# Patient Record
Sex: Male | Born: 1975
Health system: Southern US, Community
[De-identification: ages and names within clinical notes are randomized; demographics above are authoritative.]

## PROBLEM LIST (undated history)

## (undated) ENCOUNTER — Emergency Department (HOSPITAL_COMMUNITY): Payer: BC Managed Care – PPO

## (undated) DIAGNOSIS — K045 Chronic apical periodontitis: Secondary | ICD-10-CM

## (undated) DIAGNOSIS — F32A Depression, unspecified: Secondary | ICD-10-CM

## (undated) DIAGNOSIS — T7840XA Allergy, unspecified, initial encounter: Secondary | ICD-10-CM

## (undated) DIAGNOSIS — T4145XA Adverse effect of unspecified anesthetic, initial encounter: Secondary | ICD-10-CM

## (undated) DIAGNOSIS — T8859XA Other complications of anesthesia, initial encounter: Secondary | ICD-10-CM

## (undated) DIAGNOSIS — I10 Essential (primary) hypertension: Secondary | ICD-10-CM

## (undated) DIAGNOSIS — G4733 Obstructive sleep apnea (adult) (pediatric): Secondary | ICD-10-CM

## (undated) DIAGNOSIS — F329 Major depressive disorder, single episode, unspecified: Secondary | ICD-10-CM

## (undated) HISTORY — DX: Essential (primary) hypertension: I10

## (undated) HISTORY — PX: FINGER SURGERY: SHX640

## (undated) HISTORY — DX: Allergy, unspecified, initial encounter: T78.40XA

## (undated) HISTORY — DX: Obstructive sleep apnea (adult) (pediatric): G47.33

## (undated) HISTORY — PX: NASAL SINUS SURGERY: SHX719

---
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
## 1998-09-05 ENCOUNTER — Emergency Department (HOSPITAL_COMMUNITY): Admission: EM | Admit: 1998-09-05 | Discharge: 1998-09-05 | Payer: Self-pay | Admitting: Family Medicine

## 1999-01-26 ENCOUNTER — Emergency Department (HOSPITAL_COMMUNITY): Admission: EM | Admit: 1999-01-26 | Discharge: 1999-01-26 | Payer: Self-pay | Admitting: *Deleted

## 1999-03-19 ENCOUNTER — Inpatient Hospital Stay (HOSPITAL_COMMUNITY): Admission: EM | Admit: 1999-03-19 | Discharge: 1999-03-29 | Payer: Self-pay | Admitting: Emergency Medicine

## 1999-03-19 ENCOUNTER — Encounter: Payer: Self-pay | Admitting: Emergency Medicine

## 1999-03-21 ENCOUNTER — Encounter: Payer: Self-pay | Admitting: Surgery

## 1999-03-26 ENCOUNTER — Encounter: Payer: Self-pay | Admitting: Surgery

## 1999-03-29 ENCOUNTER — Encounter: Payer: Self-pay | Admitting: Surgery

## 1999-04-06 ENCOUNTER — Encounter: Payer: Self-pay | Admitting: Emergency Medicine

## 1999-04-06 ENCOUNTER — Inpatient Hospital Stay (HOSPITAL_COMMUNITY): Admission: EM | Admit: 1999-04-06 | Discharge: 1999-04-10 | Payer: Self-pay | Admitting: Emergency Medicine

## 1999-04-07 ENCOUNTER — Encounter: Payer: Self-pay | Admitting: Surgery

## 1999-04-08 ENCOUNTER — Encounter: Payer: Self-pay | Admitting: General Surgery

## 1999-04-09 ENCOUNTER — Encounter: Payer: Self-pay | Admitting: Surgery

## 1999-04-10 ENCOUNTER — Encounter: Payer: Self-pay | Admitting: Surgery

## 1999-04-11 ENCOUNTER — Encounter: Payer: Self-pay | Admitting: Surgery

## 1999-04-11 ENCOUNTER — Ambulatory Visit (HOSPITAL_COMMUNITY): Admission: RE | Admit: 1999-04-11 | Discharge: 1999-04-11 | Payer: Self-pay | Admitting: Surgery

## 1999-04-12 ENCOUNTER — Ambulatory Visit (HOSPITAL_COMMUNITY): Admission: RE | Admit: 1999-04-12 | Discharge: 1999-04-12 | Payer: Self-pay | Admitting: Surgery

## 1999-04-12 ENCOUNTER — Encounter: Payer: Self-pay | Admitting: Surgery

## 1999-04-16 ENCOUNTER — Ambulatory Visit (HOSPITAL_COMMUNITY): Admission: RE | Admit: 1999-04-16 | Discharge: 1999-04-16 | Payer: Self-pay | Admitting: Surgery

## 1999-04-16 ENCOUNTER — Encounter: Payer: Self-pay | Admitting: Surgery

## 1999-04-25 ENCOUNTER — Encounter: Payer: Self-pay | Admitting: Surgery

## 1999-04-25 ENCOUNTER — Ambulatory Visit (HOSPITAL_COMMUNITY): Admission: RE | Admit: 1999-04-25 | Discharge: 1999-04-25 | Payer: Self-pay | Admitting: Surgery

## 1999-06-11 ENCOUNTER — Ambulatory Visit (HOSPITAL_COMMUNITY): Admission: RE | Admit: 1999-06-11 | Discharge: 1999-06-11 | Payer: Self-pay | Admitting: Surgery

## 1999-06-11 ENCOUNTER — Encounter: Payer: Self-pay | Admitting: Surgery

## 2001-01-04 ENCOUNTER — Emergency Department (HOSPITAL_COMMUNITY): Admission: EM | Admit: 2001-01-04 | Discharge: 2001-01-05 | Payer: Self-pay | Admitting: *Deleted

## 2001-01-06 ENCOUNTER — Emergency Department (HOSPITAL_COMMUNITY): Admission: EM | Admit: 2001-01-06 | Discharge: 2001-01-06 | Payer: Self-pay | Admitting: Emergency Medicine

## 2001-10-22 ENCOUNTER — Emergency Department (HOSPITAL_COMMUNITY): Admission: EM | Admit: 2001-10-22 | Discharge: 2001-10-22 | Payer: Self-pay | Admitting: Internal Medicine

## 2003-12-13 ENCOUNTER — Emergency Department (HOSPITAL_COMMUNITY): Admission: EM | Admit: 2003-12-13 | Discharge: 2003-12-13 | Payer: Self-pay | Admitting: Emergency Medicine

## 2003-12-15 ENCOUNTER — Emergency Department (HOSPITAL_COMMUNITY): Admission: EM | Admit: 2003-12-15 | Discharge: 2003-12-15 | Payer: Self-pay | Admitting: Emergency Medicine

## 2004-05-25 ENCOUNTER — Emergency Department (HOSPITAL_COMMUNITY): Admission: EM | Admit: 2004-05-25 | Discharge: 2004-05-25 | Payer: Self-pay | Admitting: Family Medicine

## 2004-09-06 ENCOUNTER — Ambulatory Visit: Payer: Self-pay | Admitting: Internal Medicine

## 2004-09-09 ENCOUNTER — Ambulatory Visit: Payer: Self-pay | Admitting: Internal Medicine

## 2004-11-07 ENCOUNTER — Emergency Department (HOSPITAL_COMMUNITY): Admission: EM | Admit: 2004-11-07 | Discharge: 2004-11-07 | Payer: Self-pay | Admitting: Family Medicine

## 2004-11-10 ENCOUNTER — Emergency Department (HOSPITAL_COMMUNITY): Admission: EM | Admit: 2004-11-10 | Discharge: 2004-11-10 | Payer: Self-pay | Admitting: Family Medicine

## 2004-11-26 ENCOUNTER — Ambulatory Visit (HOSPITAL_COMMUNITY): Admission: RE | Admit: 2004-11-26 | Discharge: 2004-11-26 | Payer: Self-pay | Admitting: Orthopedic Surgery

## 2005-05-21 ENCOUNTER — Ambulatory Visit: Payer: Self-pay | Admitting: Internal Medicine

## 2005-06-17 ENCOUNTER — Ambulatory Visit (HOSPITAL_BASED_OUTPATIENT_CLINIC_OR_DEPARTMENT_OTHER): Admission: RE | Admit: 2005-06-17 | Discharge: 2005-06-17 | Payer: Self-pay | Admitting: Internal Medicine

## 2005-06-18 ENCOUNTER — Ambulatory Visit (HOSPITAL_COMMUNITY): Admission: RE | Admit: 2005-06-18 | Discharge: 2005-06-18 | Payer: Self-pay | Admitting: Internal Medicine

## 2005-06-22 ENCOUNTER — Ambulatory Visit: Payer: Self-pay | Admitting: Internal Medicine

## 2005-06-23 ENCOUNTER — Ambulatory Visit: Payer: Self-pay | Admitting: Internal Medicine

## 2005-07-08 ENCOUNTER — Ambulatory Visit: Payer: Self-pay | Admitting: Hospitalist

## 2005-07-23 ENCOUNTER — Ambulatory Visit: Payer: Self-pay | Admitting: Internal Medicine

## 2005-08-13 ENCOUNTER — Ambulatory Visit: Payer: Self-pay | Admitting: Internal Medicine

## 2005-12-31 ENCOUNTER — Ambulatory Visit: Payer: Self-pay | Admitting: Internal Medicine

## 2006-03-11 DIAGNOSIS — J019 Acute sinusitis, unspecified: Secondary | ICD-10-CM | POA: Insufficient documentation

## 2006-03-11 DIAGNOSIS — G4730 Sleep apnea, unspecified: Secondary | ICD-10-CM | POA: Insufficient documentation

## 2006-03-11 DIAGNOSIS — E669 Obesity, unspecified: Secondary | ICD-10-CM

## 2006-03-11 DIAGNOSIS — I10 Essential (primary) hypertension: Secondary | ICD-10-CM | POA: Insufficient documentation

## 2006-03-11 DIAGNOSIS — J309 Allergic rhinitis, unspecified: Secondary | ICD-10-CM | POA: Insufficient documentation

## 2006-05-05 DIAGNOSIS — G4733 Obstructive sleep apnea (adult) (pediatric): Secondary | ICD-10-CM

## 2006-05-05 HISTORY — DX: Obstructive sleep apnea (adult) (pediatric): G47.33

## 2006-06-01 DIAGNOSIS — J330 Polyp of nasal cavity: Secondary | ICD-10-CM | POA: Insufficient documentation

## 2006-08-20 ENCOUNTER — Telehealth: Payer: Self-pay | Admitting: *Deleted

## 2006-09-24 ENCOUNTER — Telehealth (INDEPENDENT_AMBULATORY_CARE_PROVIDER_SITE_OTHER): Payer: Self-pay | Admitting: *Deleted

## 2006-12-30 ENCOUNTER — Ambulatory Visit (HOSPITAL_COMMUNITY): Admission: RE | Admit: 2006-12-30 | Discharge: 2006-12-30 | Payer: Self-pay | Admitting: Internal Medicine

## 2007-01-10 ENCOUNTER — Emergency Department (HOSPITAL_COMMUNITY): Admission: EM | Admit: 2007-01-10 | Discharge: 2007-01-10 | Payer: Self-pay | Admitting: Emergency Medicine

## 2008-10-30 ENCOUNTER — Emergency Department (HOSPITAL_COMMUNITY): Admission: EM | Admit: 2008-10-30 | Discharge: 2008-10-30 | Payer: Self-pay | Admitting: Emergency Medicine

## 2009-05-01 ENCOUNTER — Emergency Department (HOSPITAL_COMMUNITY): Admission: EM | Admit: 2009-05-01 | Discharge: 2009-05-01 | Payer: Self-pay | Admitting: Family Medicine

## 2009-10-13 ENCOUNTER — Emergency Department (HOSPITAL_COMMUNITY): Admission: EM | Admit: 2009-10-13 | Discharge: 2009-10-13 | Payer: Self-pay | Admitting: Emergency Medicine

## 2010-07-16 ENCOUNTER — Inpatient Hospital Stay (INDEPENDENT_AMBULATORY_CARE_PROVIDER_SITE_OTHER)
Admission: RE | Admit: 2010-07-16 | Discharge: 2010-07-16 | Disposition: A | Discharge status: Home / Self Care | Payer: BLUE CROSS/BLUE SHIELD | Source: Ambulatory Visit | Attending: Emergency Medicine | Admitting: Emergency Medicine

## 2010-07-16 DIAGNOSIS — M79609 Pain in unspecified limb: Secondary | ICD-10-CM

## 2010-07-16 LAB — POCT I-STAT, CHEM 8
Calcium, Ion: 1.17 mmol/L (ref 1.12–1.32)
Chloride: 105 mEq/L (ref 96–112)
Creatinine, Ser: 1 mg/dL (ref 0.4–1.5)
Glucose, Bld: 100 mg/dL — ABNORMAL HIGH (ref 70–99)
Potassium: 3.6 mEq/L (ref 3.5–5.1)
Sodium: 140 mEq/L (ref 135–145)

## 2010-07-16 LAB — CBC
HCT: 42.4 % (ref 39.0–52.0)
MCH: 29.6 pg (ref 26.0–34.0)
MCV: 86 fL (ref 78.0–100.0)
RBC: 4.93 MIL/uL (ref 4.22–5.81)
RDW: 12.5 % (ref 11.5–15.5)
WBC: 7.6 10*3/uL (ref 4.0–10.5)

## 2010-09-20 NOTE — Procedures (Signed)
NAME:  Marc Johnston, Marc Johnston                ACCOUNT NO.:  [id number]   MEDICAL RECORD NO.:  [id number]          PATIENT TYPE:  OUT   LOCATION:  SLEEP CENTER                 FACILITY:  [facility]   PHYSICIAN:  Clinton D. Maple Hudson, M.D. DATE OF BIRTH:  [birth year]   DATE OF STUDY:  06/18/2005                              NOCTURNAL POLYSOMNOGRAM   REFERRING PHYSICIAN:  Dr. Mont Dutton.   DATE OF STUDY:  June 17, 2005.   INDICATION FOR STUDY:  Hypersomnia with sleep apnea.   EPWORTH SLEEPINESS SCORE:  6/24.   BMI:  28.   WEIGHT:  210 pounds.   No home medications listed.   SLEEP ARCHITECTURE:  Total sleep time 299 minutes with sleep efficiency 82%.  Stage I was 4%, stage II 53%, stages III and IV 28%, REM 15% of total sleep  time. Sleep latency 12 minutes, REM latency 77 minutes, awake after sleep  onset 13 minutes, arousal index 12.6. No bedtime medication taken.   RESPIRATORY DATA:  Apnea/hypopnea index (AHI, RDI) 5.4 obstructive events  per hour indicating very minimal obstructive sleep apnea/hypopnea syndrome  (normal ranges 2/5 episodes per hour). This included 1 central apnea, 4  obstructive apneas and 22 hypopneas. Events were positional, mostly reported  while supine. REM AHI 16 per hour.   OXYGEN DATA:  Moderate snoring, occasionally loud with oxygen desaturation  to a nadir of 85%. Mean oxygen saturation on room air was 93% through the  study.   CARDIAC DATA:  Sinus rhythm with inverted T-wave on the reporting lead.   MOVEMENT/PARASOMNIA:  The patient insisted on going at 4 a.m. to smoke in  his car. Occasional leg jerk with little effect on sleep.   IMPRESSION/RECOMMENDATIONS:  1.  Very minimal obstructive sleep apnea/hypopnea syndrome, AHI 5.4 per hour      (normal range 2/5 per hour) with positional events, moderate to loud      snoring and oxygen desaturation to a nadir of 85%.  2.  The study ended at 4 a.m. when the patient insisted on going out to      smoke in his  car.  3.  Suggest checking 12-lead EKG for T-wave configuration if clinically      appropriate.  4.  Specific therapy for sleep disordered breathing is probably not      indicated. Weight loss and sleep off flat of back would be recommended      but primary emphasis should be on smoking cessation and good sleep      hygiene.      Clinton D. Maple Hudson, M.D.  Diplomate, Biomedical engineer of Sleep Medicine  Electronically Signed     CDY/MEDQ  D:  06/22/2005 10:35:00  T:  06/22/2005 22:00:46  Job:  [id number]

## 2010-10-03 ENCOUNTER — Emergency Department (HOSPITAL_COMMUNITY): Payer: BLUE CROSS/BLUE SHIELD

## 2010-10-03 ENCOUNTER — Emergency Department (HOSPITAL_COMMUNITY)
Admission: EM | Admit: 2010-10-03 | Discharge: 2010-10-03 | Disposition: A | Discharge status: Home / Self Care | Payer: BLUE CROSS/BLUE SHIELD | Attending: Emergency Medicine | Admitting: Emergency Medicine

## 2010-10-03 DIAGNOSIS — R059 Cough, unspecified: Secondary | ICD-10-CM | POA: Insufficient documentation

## 2010-10-03 DIAGNOSIS — I10 Essential (primary) hypertension: Secondary | ICD-10-CM | POA: Insufficient documentation

## 2010-10-03 DIAGNOSIS — R05 Cough: Secondary | ICD-10-CM | POA: Insufficient documentation

## 2010-10-03 DIAGNOSIS — G8929 Other chronic pain: Secondary | ICD-10-CM | POA: Insufficient documentation

## 2010-10-03 DIAGNOSIS — R062 Wheezing: Secondary | ICD-10-CM | POA: Insufficient documentation

## 2010-12-04 ENCOUNTER — Other Ambulatory Visit (HOSPITAL_COMMUNITY): Payer: Self-pay | Admitting: Internal Medicine

## 2010-12-04 DIAGNOSIS — IMO0002 Reserved for concepts with insufficient information to code with codable children: Secondary | ICD-10-CM

## 2010-12-04 DIAGNOSIS — M25569 Pain in unspecified knee: Secondary | ICD-10-CM

## 2010-12-04 DIAGNOSIS — M7520 Bicipital tendinitis, unspecified shoulder: Secondary | ICD-10-CM

## 2010-12-06 ENCOUNTER — Ambulatory Visit (HOSPITAL_COMMUNITY)
Admission: RE | Admit: 2010-12-06 | Discharge: 2010-12-06 | Disposition: A | Discharge status: Home / Self Care | Payer: BC Managed Care – PPO | Source: Ambulatory Visit | Attending: Internal Medicine | Admitting: Internal Medicine

## 2010-12-06 DIAGNOSIS — M25569 Pain in unspecified knee: Secondary | ICD-10-CM

## 2010-12-06 DIAGNOSIS — S46909A Unspecified injury of unspecified muscle, fascia and tendon at shoulder and upper arm level, unspecified arm, initial encounter: Secondary | ICD-10-CM | POA: Insufficient documentation

## 2010-12-06 DIAGNOSIS — M545 Low back pain, unspecified: Secondary | ICD-10-CM | POA: Insufficient documentation

## 2010-12-06 DIAGNOSIS — S4980XA Other specified injuries of shoulder and upper arm, unspecified arm, initial encounter: Secondary | ICD-10-CM | POA: Insufficient documentation

## 2010-12-06 DIAGNOSIS — IMO0002 Reserved for concepts with insufficient information to code with codable children: Secondary | ICD-10-CM

## 2010-12-06 DIAGNOSIS — M7520 Bicipital tendinitis, unspecified shoulder: Secondary | ICD-10-CM

## 2010-12-06 DIAGNOSIS — M5126 Other intervertebral disc displacement, lumbar region: Secondary | ICD-10-CM | POA: Insufficient documentation

## 2010-12-06 DIAGNOSIS — M25519 Pain in unspecified shoulder: Secondary | ICD-10-CM | POA: Insufficient documentation

## 2010-12-06 DIAGNOSIS — X500XXA Overexertion from strenuous movement or load, initial encounter: Secondary | ICD-10-CM | POA: Insufficient documentation

## 2010-12-06 DIAGNOSIS — R937 Abnormal findings on diagnostic imaging of other parts of musculoskeletal system: Secondary | ICD-10-CM | POA: Insufficient documentation

## 2010-12-09 ENCOUNTER — Ambulatory Visit (HOSPITAL_COMMUNITY)
Admission: RE | Admit: 2010-12-09 | Discharge: 2010-12-09 | Disposition: A | Discharge status: Home / Self Care | Payer: BC Managed Care – PPO | Source: Ambulatory Visit | Attending: Internal Medicine | Admitting: Internal Medicine

## 2010-12-09 DIAGNOSIS — M25529 Pain in unspecified elbow: Secondary | ICD-10-CM | POA: Insufficient documentation

## 2010-12-09 DIAGNOSIS — IMO0002 Reserved for concepts with insufficient information to code with codable children: Secondary | ICD-10-CM

## 2010-12-09 DIAGNOSIS — M25569 Pain in unspecified knee: Secondary | ICD-10-CM

## 2010-12-09 DIAGNOSIS — M25429 Effusion, unspecified elbow: Secondary | ICD-10-CM | POA: Insufficient documentation

## 2010-12-09 DIAGNOSIS — M7520 Bicipital tendinitis, unspecified shoulder: Secondary | ICD-10-CM

## 2011-02-14 LAB — COMPREHENSIVE METABOLIC PANEL
Alkaline Phosphatase: 94
BUN: 10
Calcium: 9.7
Chloride: 105
GFR calc Af Amer: >60
Potassium: 3.4 — ABNORMAL LOW

## 2011-02-14 LAB — DIFFERENTIAL
Basophils Absolute: 0.1
Basophils Relative: 1
Eosinophils Absolute: 0.1
Eosinophils Relative: 1
Monocytes Relative: 6
Neutrophils Relative %: 71

## 2011-02-14 LAB — ROCKY MTN SPOTTED FVR AB, IGG-BLOOD: RMSF IgG: <1:64 {titer}

## 2011-02-14 LAB — CBC
MCHC: 35.2
MCV: 88.8
WBC: 11.5 — ABNORMAL HIGH

## 2011-03-19 ENCOUNTER — Ambulatory Visit (HOSPITAL_COMMUNITY)
Admission: RE | Admit: 2011-03-19 | Discharge: 2011-03-19 | Disposition: A | Discharge status: Home / Self Care | Payer: BC Managed Care – PPO | Source: Ambulatory Visit | Attending: Orthopedic Surgery | Admitting: Orthopedic Surgery

## 2011-03-19 DIAGNOSIS — M25519 Pain in unspecified shoulder: Secondary | ICD-10-CM | POA: Insufficient documentation

## 2011-03-19 DIAGNOSIS — IMO0001 Reserved for inherently not codable concepts without codable children: Secondary | ICD-10-CM | POA: Insufficient documentation

## 2011-03-19 DIAGNOSIS — I10 Essential (primary) hypertension: Secondary | ICD-10-CM | POA: Insufficient documentation

## 2011-03-19 DIAGNOSIS — M25619 Stiffness of unspecified shoulder, not elsewhere classified: Secondary | ICD-10-CM | POA: Insufficient documentation

## 2011-03-19 DIAGNOSIS — M6281 Muscle weakness (generalized): Secondary | ICD-10-CM | POA: Insufficient documentation

## 2011-03-19 DIAGNOSIS — IMO0002 Reserved for concepts with insufficient information to code with codable children: Secondary | ICD-10-CM | POA: Insufficient documentation

## 2011-03-19 NOTE — Progress Notes (Signed)
Occupational Therapy Evaluation  Patient Details  Name: Marc Johnston MRN: [id number] Date of Birth: April 21, 1976  Today's Date: 03/19/2011 Time: 0930-1000 Time Calculation (min): 30 min OT Eval [phone] 11' Manual Therapy [phone] 12' HEP [phone] 5' no charge Visit#: 1  of 6   Re-eval: 04/09/11  Assessment Diagnosis: Right Shoulder Strain - Dr. Teressa Senter Next MD Visit: 04/07/11 Prior Therapy: none  Past Medical History: No past medical history on file. Past Surgical History: No past surgical history on file.  Subjective Symptoms/Limitations Symptoms: I want to get my arm back to where I can go to work, play with my kids, and ride my motorcycle. Limitations: [name] was lifting a 90 pound box of meat at work on 11/22/10, and strained his right shoulder.  He consulted with Dr. Teressa Senter and was given a strengthening HEP.  [name] reports that he was not doing the exercises correctly, so he quit doing them.  Dr. Teressa Senter has referred him to OT to strengthen his right shoulder gridle and increase internal rotation in both shoulder.   Pain Assessment Currently in Pain?: Yes Pain Score:   5 Pain Location: Shoulder Pain Orientation: Right Pain Type: Acute pain  Precautions/Restrictions  Precautions Precaution Comments:  (complete all IR and ER in neutral (adducted) position)  Prior Functioning     Assessment RUE Assessment RUE Assessment:  (assessed in seated, IR and ER 0 abduction) RUE AROM (degrees) Right Shoulder Flexion  0-170: 160 Degrees Right Shoulder ABduction 0-140: 147 Degrees Right Shoulder Internal Rotation  0-70: 55 Degrees Right Shoulder External Rotation  0-90: 25 Degrees Right Elbow Flexion/Extension 0-135-150: 140  RUE Strength Right Shoulder Flexion: 5/5 Right Shoulder ABduction: 5/5 Right Shoulder Internal Rotation: 5/5 Right Shoulder External Rotation: 5/5 LUE AROM (degrees) Left Shoulder Internal Rotation  0-70: 75 Degrees Left Shoulder External  Rotation  0-90: 35 Degrees  Exercise/Treatments Supine Protraction: PROM;10 reps Horizontal ABduction: PROM;10 reps External Rotation: PROM;10 reps Internal Rotation: PROM;10 reps Flexion: PROM;10 reps ABduction: PROM;10 reps       Manual Therapy Manual Therapy: Myofascial release Myofascial Release: MFR and manual stretching to decrease pain and fascial restrictions in right shoulder girlde region.  Minimal fascial restrictions noted in his right shoulder region.  [phone]  Occupational Therapy Assessment and Plan OT Assessment and Plan Clinical Impression Statement: A:  Patient presents with increased pain and fascial restrictions and decreased AROM and sustained activity tolerance in his right shoulder, causing decreased I with his B/IADL, work, and leisure activities. Rehab Potential: Good OT Frequency: Min 2X/week OT Duration: Other (comment) (3 weeks) OT Treatment/Interventions: Self-care/ADL training;Therapeutic exercise;Manual therapy;Therapeutic activities;Patient/family education (modalities as needed, HEP:  Shoulder strengthening) OT Plan: P:  Skilled OT intervention to decrease pain and restrictions and increase AROM and sustained activity tolerance in his right shoulder region for return to full I with all daily activities.  Treatment Plan:  MFR to right shoulder as above.  Ther Ex:  strengthening exercises for shoulder seated and prone.  wall wash, overhead lace, x to v, w arms, tband for scapular stability and progress to power tower as able, cybex press and row, UBE.  COMPLETE ALL ER AND IR IN ) ABD.   Goals Short Term Goals Time to Complete Short Term Goals: 3 weeks Short Term Goal 1: Patient will be educated and I with HEP. Short Term Goal 2: Patient will increase right shoulder and left int/ext rotation to WNL AROM for increased ability to play with his kids. Short Term Goal 3: Patient  will increase sustained activity tolerance in his right shoulder from fair + to  good + for increased ability to complete work activities. Short Term Goal 4: Patient will increase right scapular stability from good to normal. Short Term Goal 5: Patient will decrease pain from 5/10 to 2/10 in his right shoulder while picking up his children. Additional Short Term Goals?: Yes Short Term Goal 6: Patient will decrease fascial restrictions in his right shoulder  and left internal/external rotator cuff from min-mod to trace. End of Session Patient Active Problem List  Diagnoses  . OBESITY  . HYPERTENSION  . SINUSITIS, ACUTE  . NASAL POLYP  . ALLERGIC RHINITIS  . SLEEP APNEA  . Sprain and strain of unspecified site of shoulder and upper arm  . Muscle weakness (generalized)  . Pain in joint, shoulder region   End of Session Activity Tolerance: Patient tolerated treatment well General Behavior During Session: [facility] for tasks performed Cognition: [facility] for tasks performed  Time Calculation Start Time: 0930 Stop Time: 1000 Time Calculation (min): 30 min  Shirlean Mylar, OTR/L  03/19/2011, 10:19 AM

## 2011-03-24 ENCOUNTER — Ambulatory Visit (HOSPITAL_COMMUNITY)
Admission: RE | Admit: 2011-03-24 | Discharge: 2011-03-24 | Disposition: A | Discharge status: Home / Self Care | Payer: BC Managed Care – PPO | Source: Ambulatory Visit | Attending: Orthopedic Surgery | Admitting: Orthopedic Surgery

## 2011-03-24 DIAGNOSIS — IMO0002 Reserved for concepts with insufficient information to code with codable children: Secondary | ICD-10-CM

## 2011-03-24 DIAGNOSIS — M25519 Pain in unspecified shoulder: Secondary | ICD-10-CM

## 2011-03-24 DIAGNOSIS — M6281 Muscle weakness (generalized): Secondary | ICD-10-CM

## 2011-03-24 NOTE — Progress Notes (Signed)
Occupational Therapy Treatment  Patient Details  Name: Marc Johnston MRN: [id number] Date of Birth: 26-May-1975  Today's Date: 03/24/2011 Time: 2130-8657 Time Calculation (min): 45 min Manual Therapy [phone] 14' Therapeutic Exercises [phone] 30' Visit#: 2  of 6   Re-eval: 04/09/11    Subjective Symptoms/Limitations Symptoms: S:  Its feeling a little bit better. Pain Assessment Currently in Pain?: Yes Pain Score:   1 Pain Location: Shoulder Pain Orientation: Right Pain Type: Acute pain  O:  Exercise/Treatments Supine Protraction: PROM;10 reps Horizontal ABduction: PROM;10 reps External Rotation: PROM;10 reps Internal Rotation: PROM;10 reps Flexion: PROM;10 reps ABduction: PROM;10 reps Seated Protraction: 10 reps;Strengthening Protraction Weight (lbs): 1 Horizontal ABduction: Strengthening;10 reps Horizontal ABduction Weight (lbs): 1 External Rotation: Strengthening;10 reps External Rotation Weight (lbs): 1 Internal Rotation: Strengthening;10 reps Internal Rotation Weight (lbs): 1 Flexion: Strengthening;10 reps Flexion Weight (lbs): 1 Abduction: Strengthening;10 reps ABduction Weight (lbs): 1 Prone  Retraction: Strengthening;10 reps Retraction Weight (lbs): 1 Flexion: Strengthening;10 reps Flexion Weight (lbs): 1 Extension: Strengthening;10 reps Extension Weight (lbs): 1 Horizontal ABduction 1: Strengthening;10 reps Horizontal ABduction 1 Weight (lbs): 1 Horizontal ABduction 2: Strengthening Horizontal ABduction 2 Weight (lbs): 1 ROM / Strengthening / Isometric Strengthening UBE (Upper Arm Bike): 3' and 3' and 1.5 Cybex Press: 1.5 plate;10 reps Cybex Row: 1.5 plate;10 reps Wall Wash: 3' with 1# Thumb Tacks: 1' "W" Arms: begin next visit X to V Arms: begin next visit Prot/Ret//Elev/Dep: 1'   Manual Therapy Manual Therapy: Myofascial release Myofascial Release: MFR and manual stretching to decrease pain and fascial restrictions in right shoulder girdle  region.  Minimal fascial restrictions noted in his right shoulder region.[phone]  Occupational Therapy Assessment and Plan OT Assessment and Plan Clinical Impression Statement: A:  Less pain this date.  Added strengthening with 1#.   OT Plan: P:  Begin strengthening with 2#, add x to v and w arms.   Goals Short Term Goals Time to Complete Short Term Goals: 3 weeks Short Term Goal 1: Patient will be educated and I with HEP. Short Term Goal 1 Progress: Progressing toward goal Short Term Goal 2: Patient will increase right shoulder and left int/ext rotation to WNL AROM for increased ability to play with his kids. Short Term Goal 2 Progress: Progressing toward goal Short Term Goal 3: Patient will increase sustained activity tolerance in his right shoulder from fair + to good + for increased ability to complete work activities. Short Term Goal 3 Progress: Progressing toward goal Short Term Goal 4: Patient will increase right scapular stability from good to normal. Short Term Goal 4 Progress: Progressing toward goal Short Term Goal 5: Patient will decrease pain from 5/10 to 2/10 in his right shoulder while picking up his children. Short Term Goal 5 Progress: Progressing toward goal Additional Short Term Goals?: Yes Short Term Goal 6: Patient will decrease fascial restrictions in his right shoulder  and left internal/external rotator cuff from min-mod to trace. Short Term Goal 6 Progress: Progressing toward goal End of Session Patient Active Problem List  Diagnoses  . OBESITY  . HYPERTENSION  . SINUSITIS, ACUTE  . NASAL POLYP  . ALLERGIC RHINITIS  . SLEEP APNEA  . Sprain and strain of unspecified site of shoulder and upper arm  . Muscle weakness (generalized)  . Pain in joint, shoulder region   End of Session Activity Tolerance: Patient tolerated treatment well General Behavior During Session: [facility] for tasks performed Cognition: [facility] for tasks performed   Shirlean Mylar,  OTR/L  03/24/2011,  10:15 AM

## 2011-03-26 ENCOUNTER — Ambulatory Visit (HOSPITAL_COMMUNITY)
Admission: RE | Admit: 2011-03-26 | Discharge: 2011-03-26 | Disposition: A | Discharge status: Home / Self Care | Payer: BC Managed Care – PPO | Source: Ambulatory Visit | Attending: Orthopedic Surgery | Admitting: Orthopedic Surgery

## 2011-03-26 DIAGNOSIS — M6281 Muscle weakness (generalized): Secondary | ICD-10-CM

## 2011-03-26 DIAGNOSIS — IMO0002 Reserved for concepts with insufficient information to code with codable children: Secondary | ICD-10-CM

## 2011-03-26 DIAGNOSIS — M25519 Pain in unspecified shoulder: Secondary | ICD-10-CM

## 2011-03-26 NOTE — Progress Notes (Signed)
Occupational Therapy Treatment  Patient Details  Name: COAL NEARHOOD MRN: [id number] Date of Birth: 09-May-1975  Today's Date: 03/26/2011 Time: 4098-1191 Time Calculation (min): 56 min Visit#: 3  of 6   Re-eval: 04/09/11 [facility] [phone] 13' Therapeutic Exercise  [phone] 32'    Subjective Symptoms/Limitations Symptoms: S:  She about killed me the other day.  It was hurting last night. Pain Assessment Currently in Pain?: Yes Pain Score:   3 Pain Location: Shoulder Pain Orientation: Right Pain Type: Acute pain   Exercise/Treatments Supine Protraction: PROM;10 reps Horizontal ABduction: PROM;10 reps External Rotation: PROM;10 reps Internal Rotation: PROM;10 reps Flexion: PROM;10 reps ABduction: PROM;10 reps Seated Elevation:  (add next session with red band) Extension:  (add next session with red tband) Retraction:  (add next session with red tband) Row:  (add next session with red tband) Protraction: Strengthening;12 reps Protraction Weight (lbs): 1 Horizontal ABduction: Strengthening;12 reps Horizontal ABduction Weight (lbs): 1 External Rotation: Strengthening;12 reps External Rotation Weight (lbs): 1 Internal Rotation: Strengthening;12 reps Internal Rotation Weight (lbs): 1 Flexion: Strengthening;12 reps Flexion Weight (lbs): 1 Abduction: Strengthening;12 reps ABduction Weight (lbs): 1 Prone  Retraction: Strengthening;12 reps Retraction Weight (lbs): 1 Flexion: Strengthening;12 reps Flexion Weight (lbs): 1 Extension: Strengthening;12 reps Extension Weight (lbs): 1 Horizontal ABduction 1: Strengthening;12 reps Horizontal ABduction 1 Weight (lbs): 1 Horizontal ABduction 2: Strengthening;12 reps Horizontal ABduction 2 Weight (lbs): 1 ROM / Strengthening / Isometric Strengthening UBE (Upper Arm Bike): 3' and 3' and 2.0 Cybex Press: 1.5 plate;15 reps Cybex Row: 1.5 plate;15 reps Wall Wash: 4' with 1# Thumb Tacks: 1' "W" Arms: x 10 X to V Arms:  x10 Prot/Ret//Elev/Dep: 1'  Manual Therapy Manual Therapy: Myofascial release Myofascial Release: MFR and manual stretching to decrease pain and fascial restrictions in right shoulder girlde region. Minimal fascial restrictions noted in his right shoulder region.   Occupational Therapy Assessment and Plan OT Assessment and Plan Clinical Impression Statement: A:  Patient stated he had decreased pain (2/10) after session and decreased stiffness.  Added X to V and W arms OT Plan: P:  Increase to 2# with strengthening.   Goals Short Term Goals Time to Complete Short Term Goals: 3 weeks Short Term Goal 1: Patient will be educated and I with HEP. Short Term Goal 2: Patient will increase right shoulder and left int/ext rotation to WNL AROM for increased ability to play with his kids. Short Term Goal 3: Patient will increase sustained activity tolerance in his right shoulder from fair + to good + for increased ability to complete work activities. Short Term Goal 4: Patient will increase right scapular stability from good to normal. Short Term Goal 5: Patient will decrease pain from 5/10 to 2/10 in his right shoulder while picking up his children. Additional Short Term Goals?: Yes Short Term Goal 6: Patient will decrease fascial restrictions in his right shoulder  and left internal/external rotator cuff from min-mod to trace. End of Session Patient Active Problem List  Diagnoses  . OBESITY  . HYPERTENSION  . SINUSITIS, ACUTE  . NASAL POLYP  . ALLERGIC RHINITIS  . SLEEP APNEA  . Sprain and strain of unspecified site of shoulder and upper arm  . Muscle weakness (generalized)  . Pain in joint, shoulder region   End of Session Activity Tolerance: Patient tolerated treatment well General Behavior During Session: [facility] for tasks performed Cognition: [facility] for tasks performed   Amika Tassin L. Malika Demario, COTA/L  03/26/2011, 12:03 PM

## 2011-03-31 ENCOUNTER — Ambulatory Visit (HOSPITAL_COMMUNITY)
Admission: RE | Admit: 2011-03-31 | Discharge: 2011-03-31 | Disposition: A | Discharge status: Home / Self Care | Payer: BC Managed Care – PPO | Source: Ambulatory Visit | Attending: Orthopedic Surgery | Admitting: Orthopedic Surgery

## 2011-03-31 DIAGNOSIS — IMO0002 Reserved for concepts with insufficient information to code with codable children: Secondary | ICD-10-CM

## 2011-03-31 DIAGNOSIS — M25519 Pain in unspecified shoulder: Secondary | ICD-10-CM

## 2011-03-31 DIAGNOSIS — M6281 Muscle weakness (generalized): Secondary | ICD-10-CM

## 2011-03-31 NOTE — Progress Notes (Signed)
Occupational Therapy Treatment  Patient Details  Name: Marc Johnston MRN: [id number] Date of Birth: 12-03-1975  Today's Date: 03/31/2011 Time: 1478-2956 Time Calculation (min): 43 min Manual Therapy [phone] 11' Therapeutic Exercises [phone] 29' Visit#: 4  of 6   Re-eval: 04/09/11    Subjective Symptoms/Limitations Symptoms: S:  I put a radiator in on Saturday.  My shoulder has hurt ever since. Pain Assessment Currently in Pain?: Yes Pain Score:   4 Pain Orientation: Right Pain Type: Acute pain  O:  Exercise/Treatments Supine Protraction: PROM;10 reps Horizontal ABduction: PROM;10 reps External Rotation: PROM;10 reps Internal Rotation: PROM;10 reps Flexion: PROM;10 reps ABduction: PROM;10 reps Seated Protraction: Strengthening;10 reps Protraction Weight (lbs): 2 Horizontal ABduction: Strengthening;10 reps Horizontal ABduction Weight (lbs): 2 External Rotation: Strengthening;10 reps External Rotation Weight (lbs): 2 Internal Rotation: Strengthening;10 reps Internal Rotation Weight (lbs): 2 Flexion: Strengthening;10 reps Flexion Weight (lbs): 2 Abduction: Strengthening;10 reps ABduction Weight (lbs): 2 Prone  Retraction: Strengthening;10 reps Retraction Weight (lbs): 2 Flexion: Strengthening;10 reps Flexion Weight (lbs): 2 Extension: Strengthening;10 reps Extension Weight (lbs): 2 Horizontal ABduction 1: Strengthening;10 reps Horizontal ABduction 1 Weight (lbs): 2 Horizontal ABduction 2: Strengthening;10 reps Horizontal ABduction 2 Weight (lbs): 2 ROM / Strengthening / Isometric Strengthening UBE (Upper Arm Bike): 3' and 3' with 2.5 Cybex Press: 2 plate;10 reps Cybex Row: 2 plate;10 reps Wall Wash: 2' with 2# Thumb Tacks: 1' "W" Arms: 2# x 15 X to V Arms: 2# x 15 Prot/Ret//Elev/Dep: 1'   Manual Therapy Manual Therapy: Myofascial release Myofascial Release: MFR and manual stretching to decrease pain and fascial restrictions in right shoulder region.   [phone]  Occupational Therapy Assessment and Plan OT Assessment and Plan Clinical Impression Statement: A:  Increased to 2# with seated strengthening and wall wash.  Sent progress note to lawyer. OT Plan: P:  Increase reps with strengthening.   Goals Short Term Goals Time to Complete Short Term Goals: 3 weeks Short Term Goal 1: Patient will be educated and I with HEP. Short Term Goal 1 Progress: Progressing toward goal Short Term Goal 2: Patient will increase right shoulder and left int/ext rotation to WNL AROM for increased ability to play with his kids. Short Term Goal 2 Progress: Progressing toward goal Short Term Goal 3: Patient will increase sustained activity tolerance in his right shoulder from fair + to good + for increased ability to complete work activities. Short Term Goal 3 Progress: Progressing toward goal Short Term Goal 4: Patient will increase right scapular stability from good to normal. Short Term Goal 4 Progress: Progressing toward goal Short Term Goal 5: Patient will decrease pain from 5/10 to 2/10 in his right shoulder while picking up his children. Short Term Goal 5 Progress: Progressing toward goal Additional Short Term Goals?: Yes Short Term Goal 6: Patient will decrease fascial restrictions in his right shoulder  and left internal/external rotator cuff from min-mod to trace. Short Term Goal 6 Progress: Progressing toward goal End of Session Patient Active Problem List  Diagnoses  . OBESITY  . HYPERTENSION  . SINUSITIS, ACUTE  . NASAL POLYP  . ALLERGIC RHINITIS  . SLEEP APNEA  . Sprain and strain of unspecified site of shoulder and upper arm  . Muscle weakness (generalized)  . Pain in joint, shoulder region   End of Session Activity Tolerance: Patient tolerated treatment well General Behavior During Session: [facility] for tasks performed Cognition: [facility] for tasks performed   Shirlean Mylar, OTR/L  03/31/2011, 10:16 AM

## 2011-04-02 ENCOUNTER — Ambulatory Visit (HOSPITAL_COMMUNITY)
Admission: RE | Admit: 2011-04-02 | Discharge: 2011-04-02 | Disposition: A | Discharge status: Home / Self Care | Payer: BC Managed Care – PPO | Source: Ambulatory Visit | Attending: Orthopedic Surgery | Admitting: Orthopedic Surgery

## 2011-04-02 DIAGNOSIS — IMO0002 Reserved for concepts with insufficient information to code with codable children: Secondary | ICD-10-CM

## 2011-04-02 DIAGNOSIS — M25519 Pain in unspecified shoulder: Secondary | ICD-10-CM

## 2011-04-02 DIAGNOSIS — M6281 Muscle weakness (generalized): Secondary | ICD-10-CM

## 2011-04-02 NOTE — Progress Notes (Signed)
Occupational Therapy Treatment  Patient Details  Name: Marc Johnston MRN: [id number] Date of Birth: March 12, 1976  Today's Date: 04/02/2011 Time: 4098-1191 Time Calculation (min): 47 min Visit#: 5  of 6   Re-eval: 04/09/11 [facility]  [phone] 13' Therapeutic  Exercise [phone] 32'    Subjective Symptoms/Limitations Symptoms: S:  I am just waiting for my lawyer to call me. Pain Assessment Currently in Pain?: Yes Pain Score:   2 Pain Location: Shoulder Pain Orientation: Right Pain Type: Acute pain   Exercise/Treatments Supine Protraction: PROM;10 reps Horizontal ABduction: PROM;10 reps External Rotation: PROM;10 reps Internal Rotation: PROM;10 reps Flexion: PROM;10 reps ABduction: PROM;10 reps Seated Extension: Theraband;10 reps Theraband Level (Shoulder Extension): Level 2 (Red) Retraction: Theraband;10 reps Theraband Level (Shoulder Retraction): Level 2 (Red) Row: Theraband;10 reps Theraband Level (Shoulder Row): Level 2 (Red) Protraction: Strengthening;12 reps Protraction Weight (lbs): 2 Horizontal ABduction: Strengthening;12 reps Horizontal ABduction Weight (lbs): 2 External Rotation: Strengthening;12 reps;Theraband;10 reps Theraband Level (Shoulder External Rotation): Level 2 (Red) External Rotation Weight (lbs): 2 Internal Rotation: Strengthening;12 reps;Theraband;10 reps Theraband Level (Shoulder Internal Rotation): Level 2 (Red) Internal Rotation Weight (lbs): 2 Flexion: Strengthening;12 reps Flexion Weight (lbs): 2 Abduction: Strengthening;12 reps ABduction Weight (lbs): 2 Prone  Retraction: Strengthening;10 reps Retraction Weight (lbs): 2 Flexion: Strengthening;10 reps Flexion Weight (lbs): 2 Extension: Strengthening;10 reps Extension Weight (lbs): 2 Horizontal ABduction 1: Strengthening;10 reps Horizontal ABduction 1 Weight (lbs): 2 Horizontal ABduction 2: Strengthening;10 reps Horizontal ABduction 2 Weight (lbs): 2   ROM / Strengthening  / Isometric Strengthening UBE (Upper Arm Bike): 3' and 3' with 3.0 Cybex Press: 2 plate;15 reps Cybex Row: 2 plate;15 reps Wall Wash: 3' with 2# Thumb Tacks: 1' "W" Arms: 2# x 15 X to V Arms: 2# x 15 Prot/Ret//Elev/Dep: 1'   Manual Therapy Manual Therapy: Myofascial release Myofascial Release: MFR and manual stretching to decrease pain and fascial restrictions in right shoulder girlde region. Minimal fascial restrictions noted in his right shoulder region.   Occupational Therapy Assessment and Plan OT Assessment and Plan Clinical Impression Statement: A:  Added tband scapular strengthening with red Rehab Potential: Good OT Plan: P: Increase reps with weights and tband.   Goals Short Term Goals Time to Complete Short Term Goals: 3 weeks Short Term Goal 1: Patient will be educated and I with HEP. Short Term Goal 2: Patient will increase right shoulder and left int/ext rotation to WNL AROM for increased ability to play with his kids. Short Term Goal 3: Patient will increase sustained activity tolerance in his right shoulder from fair + to good + for increased ability to complete work activities. Short Term Goal 4: Patient will increase right scapular stability from good to normal. Short Term Goal 5: Patient will decrease pain from 5/10 to 2/10 in his right shoulder while picking up his children. Additional Short Term Goals?: Yes Short Term Goal 6: Patient will decrease fascial restrictions in his right shoulder  and left internal/external rotator cuff from min-mod to trace. End of Session Patient Active Problem List  Diagnoses  . OBESITY  . HYPERTENSION  . SINUSITIS, ACUTE  . NASAL POLYP  . ALLERGIC RHINITIS  . SLEEP APNEA  . Sprain and strain of unspecified site of shoulder and upper arm  . Muscle weakness (generalized)  . Pain in joint, shoulder region   End of Session Activity Tolerance: Patient tolerated treatment well General Behavior During Session: [facility] for tasks  performed Cognition: [facility] for tasks performed   Staci Carver L. Noralee Stain, COTA/L  04/02/2011,  12:05 PM

## 2011-04-07 ENCOUNTER — Ambulatory Visit (HOSPITAL_COMMUNITY): Payer: BC Managed Care – PPO | Admitting: Specialist

## 2011-04-09 ENCOUNTER — Ambulatory Visit (HOSPITAL_COMMUNITY)
Admission: RE | Admit: 2011-04-09 | Discharge: 2011-04-09 | Disposition: A | Discharge status: Home / Self Care | Payer: BC Managed Care – PPO | Source: Ambulatory Visit | Attending: Orthopedic Surgery | Admitting: Orthopedic Surgery

## 2011-04-09 DIAGNOSIS — IMO0002 Reserved for concepts with insufficient information to code with codable children: Secondary | ICD-10-CM

## 2011-04-09 DIAGNOSIS — M25619 Stiffness of unspecified shoulder, not elsewhere classified: Secondary | ICD-10-CM | POA: Insufficient documentation

## 2011-04-09 DIAGNOSIS — I10 Essential (primary) hypertension: Secondary | ICD-10-CM | POA: Insufficient documentation

## 2011-04-09 DIAGNOSIS — M25519 Pain in unspecified shoulder: Secondary | ICD-10-CM

## 2011-04-09 DIAGNOSIS — M6281 Muscle weakness (generalized): Secondary | ICD-10-CM | POA: Insufficient documentation

## 2011-04-09 DIAGNOSIS — IMO0001 Reserved for inherently not codable concepts without codable children: Secondary | ICD-10-CM | POA: Insufficient documentation

## 2011-04-09 NOTE — Progress Notes (Signed)
Occupational Therapy Treatment  Patient Details  Name: Marc Johnston MRN: [id number] Date of Birth: 20-May-1975  Today's Date: 04/09/2011 Time: 4098-1191 Time Calculation (min): 46 min Manual Therapy [phone] 9' Therapeutic Exercises [phone] 36' Visit#: 6  of 14   Re-eval: 04/30/11    Subjective Symptoms/Limitations Symptoms: S:  I went to the MD.  He said continue 4 weeks and schedule a FCE in 4 weeks. Pain Assessment Currently in Pain?: Yes Pain Score:   2 Pain Location: Shoulder Pain Orientation: Right Pain Type: Acute pain  O:  Exercise/Treatments Supine Protraction: PROM;10 reps Horizontal ABduction: PROM;10 reps External Rotation: PROM;10 reps Internal Rotation: PROM;10 reps Flexion: PROM;10 reps ABduction: PROM;10 reps Seated Extension: Theraband;15 reps (blue) Retraction: Theraband;15 reps (blue) Row: Theraband;15 reps (blue) Protraction: Strengthening;15 reps Protraction Weight (lbs): 2 Horizontal ABduction: Strengthening;15 reps Horizontal ABduction Weight (lbs): 2 External Rotation: Strengthening;Theraband;15 reps (blue) External Rotation Weight (lbs): 2 Internal Rotation: Strengthening;Theraband;15 reps (blue) Internal Rotation Weight (lbs): 2 Flexion: Strengthening;15 reps Flexion Weight (lbs): 2 Abduction: Strengthening;15 reps ABduction Weight (lbs): 2 Prone  Retraction: Strengthening;15 reps Retraction Weight (lbs): 2 Flexion: Strengthening;15 reps Flexion Weight (lbs): 2 Extension: Strengthening;15 reps Extension Weight (lbs): 2 Horizontal ABduction 1: Strengthening;15 reps Horizontal ABduction 1 Weight (lbs): 2 Horizontal ABduction 2: Strengthening;15 reps Horizontal ABduction 2 Weight (lbs): 2 ROM / Strengthening / Isometric Strengthening UBE (Upper Arm Bike): 3' and 3' with 3.0 Cybex Press: 2 plate;20 reps Cybex Row: 2 plate;20 reps Wall Wash: 4' with 2# Thumb Tacks: 1' "W" Arms: 2# x 15 X to V Arms: 2# x 15 Prot/Ret//Elev/Dep:  1'   Manual Therapy Manual Therapy: Myofascial release Myofascial Release: MFR and manual stretching to decrease pain and fascial restrictions in right shoulder region.  [phone]   Occupational Therapy Assessment and Plan OT Assessment and Plan Clinical Impression Statement: A:  Increased to blue tband. OT Plan: P:  Attempt 3# wtih strengthening exercises.   Goals Short Term Goals Time to Complete Short Term Goals: 3 weeks Short Term Goal 1: Patient will be educated and I with HEP. Short Term Goal 1 Progress: Progressing toward goal Short Term Goal 2: Patient will increase right shoulder and left int/ext rotation to WNL AROM for increased ability to play with his kids. Short Term Goal 2 Progress: Progressing toward goal Short Term Goal 3: Patient will increase sustained activity tolerance in his right shoulder from fair + to good + for increased ability to complete work activities. Short Term Goal 3 Progress: Progressing toward goal Short Term Goal 4: Patient will increase right scapular stability from good to normal. Short Term Goal 4 Progress: Progressing toward goal Short Term Goal 5: Patient will decrease pain from 5/10 to 2/10 in his right shoulder while picking up his children. Short Term Goal 5 Progress: Progressing toward goal Additional Short Term Goals?: Yes Short Term Goal 6: Patient will decrease fascial restrictions in his right shoulder  and left internal/external rotator cuff from min-mod to trace. End of Session Patient Active Problem List  Diagnoses  . OBESITY  . HYPERTENSION  . SINUSITIS, ACUTE  . NASAL POLYP  . ALLERGIC RHINITIS  . SLEEP APNEA  . Sprain and strain of unspecified site of shoulder and upper arm  . Muscle weakness (generalized)  . Pain in joint, shoulder region       Shirlean Mylar, OTR/L  04/09/2011, 10:28 AM

## 2011-04-14 ENCOUNTER — Ambulatory Visit (HOSPITAL_COMMUNITY): Payer: BC Managed Care – PPO | Admitting: Occupational Therapy

## 2011-04-15 ENCOUNTER — Ambulatory Visit (HOSPITAL_COMMUNITY): Payer: BC Managed Care – PPO | Admitting: Occupational Therapy

## 2011-04-15 ENCOUNTER — Telehealth (HOSPITAL_COMMUNITY): Payer: Self-pay

## 2011-04-16 ENCOUNTER — Ambulatory Visit (HOSPITAL_COMMUNITY)
Admission: RE | Admit: 2011-04-16 | Discharge: 2011-04-16 | Disposition: A | Discharge status: Home / Self Care | Payer: BC Managed Care – PPO | Source: Ambulatory Visit | Attending: Orthopedic Surgery | Admitting: Orthopedic Surgery

## 2011-04-16 DIAGNOSIS — M25519 Pain in unspecified shoulder: Secondary | ICD-10-CM

## 2011-04-16 DIAGNOSIS — M6281 Muscle weakness (generalized): Secondary | ICD-10-CM

## 2011-04-16 DIAGNOSIS — IMO0002 Reserved for concepts with insufficient information to code with codable children: Secondary | ICD-10-CM

## 2011-04-16 NOTE — Progress Notes (Signed)
Occupational Therapy Treatment  Patient Details  Name: Marc Johnston MRN: [id number] Date of Birth: 02-27-1976  Today's Date: 04/16/2011 Time: 0102-7253 Time Calculation (min): 54 min Visit#: 7  of 14   Re-eval: 04/30/11 Therapeutic Exercise 450-528 38' Manual Therapy [phone] 15'    Subjective Symptoms/Limitations Symptoms: S:  I have been sick this weekend and we had some bad family stuff too. Pain Assessment Currently in Pain?: Yes Pain Score:   2 Pain Location: Shoulder Pain Orientation: Right Pain Type: Acute pain   Exercise/Treatments Supine Protraction: PROM;10 reps Horizontal ABduction: PROM;10 reps External Rotation: PROM;10 reps Internal Rotation: PROM;10 reps Flexion: PROM;10 reps ABduction: PROM;10 reps Seated Extension: Theraband;15 reps Theraband Level (Shoulder Extension): Level 2 (Red) Retraction: Theraband;15 reps Theraband Level (Shoulder Retraction): Level 2 (Red) Row: Theraband;15 reps Theraband Level (Shoulder Row): Level 2 (Red) Protraction: Strengthening;15 reps Protraction Weight (lbs): 3# Horizontal ABduction: Strengthening;15 reps Horizontal ABduction Weight (lbs): 3# External Rotation: Strengthening;Theraband;15 reps Theraband Level (Shoulder External Rotation): Level 2 (Red) External Rotation Weight (lbs): 3# Internal Rotation: Strengthening;Theraband;15 reps Theraband Level (Shoulder Internal Rotation): Level 2 (Red) Internal Rotation Weight (lbs): 3# Flexion: Strengthening;15 reps Flexion Weight (lbs): 3# Abduction: Strengthening;15 reps ABduction Weight (lbs): 3# Prone  Retraction: Strengthening;15 reps Retraction Weight (lbs): 2 Flexion: Strengthening;15 reps Flexion Weight (lbs): 2 Extension: Strengthening;15 reps Extension Weight (lbs): 2 Horizontal ABduction 1: Strengthening;15 reps Horizontal ABduction 1 Weight (lbs): 2 Horizontal ABduction 2: Strengthening;15 reps Horizontal ABduction 2 Weight (lbs): 2 ROM /  Strengthening / Isometric Strengthening UBE (Upper Arm Bike): 3' and 3' with 3.0 Cybex Press: 2.5 plate;15 reps Cybex Row: 2.5 plate;15 reps Wall Wash: 4' with 2# Thumb Tacks: 1' "W" Arms: 3# x 10 X to V Arms: 3# x 10 Prot/Ret//Elev/Dep: 1'    Manual Therapy Manual Therapy: Myofascial release Myofascial Release: MFR and manual stretching to decrease pain and fascial restrictions in right shoulder girlde region. Minimal fascial restrictions noted in his right shoulder region.   Occupational Therapy Assessment and Plan OT Assessment and Plan Clinical Impression Statement: A:  Increased seated ther-ex to 3# OT Plan: P:  Increase reps and attempt to increase prone to 3#.   Goals Short Term Goals Time to Complete Short Term Goals: 3 weeks Short Term Goal 1: Patient will be educated and I with HEP. Short Term Goal 2: Patient will increase right shoulder and left int/ext rotation to WNL AROM for increased ability to play with his kids. Short Term Goal 3: Patient will increase sustained activity tolerance in his right shoulder from fair + to good + for increased ability to complete work activities. Short Term Goal 4: Patient will increase right scapular stability from good to normal. Short Term Goal 5: Patient will decrease pain from 5/10 to 2/10 in his right shoulder while picking up his children. Additional Short Term Goals?: Yes Short Term Goal 6: Patient will decrease fascial restrictions in his right shoulder  and left internal/external rotator cuff from min-mod to trace. End of Session Patient Active Problem List  Diagnoses  . OBESITY  . HYPERTENSION  . SINUSITIS, ACUTE  . NASAL POLYP  . ALLERGIC RHINITIS  . SLEEP APNEA  . Sprain and strain of unspecified site of shoulder and upper arm  . Muscle weakness (generalized)  . Pain in joint, shoulder region   End of Session Activity Tolerance: Patient tolerated treatment well General Behavior During Session: [name] for tasks  performed Cognition: [name] for tasks performed  [name], COTA/L  04/16/2011, 6:05 PM

## 2011-04-18 ENCOUNTER — Ambulatory Visit (HOSPITAL_COMMUNITY)
Admission: RE | Admit: 2011-04-18 | Discharge: 2011-04-18 | Disposition: A | Discharge status: Home / Self Care | Payer: BC Managed Care – PPO | Source: Ambulatory Visit | Attending: Orthopedic Surgery | Admitting: Orthopedic Surgery

## 2011-04-18 DIAGNOSIS — M6281 Muscle weakness (generalized): Secondary | ICD-10-CM

## 2011-04-18 DIAGNOSIS — M25519 Pain in unspecified shoulder: Secondary | ICD-10-CM

## 2011-04-18 DIAGNOSIS — IMO0002 Reserved for concepts with insufficient information to code with codable children: Secondary | ICD-10-CM

## 2011-04-18 NOTE — Progress Notes (Signed)
Occupational Therapy Treatment  Patient Details  Name: Marc Johnston MRN: [id number] Date of Birth: 1975/07/22  Today's Date: 04/18/2011 Time: 9147-8295 Time Calculation (min): 58 min Visit#: 8  of 14   Re-eval: 04/30/11 Manual Therapy [phone] 15' Therapeutic Exercise 122-204 42'    Subjective Symptoms/Limitations Symptoms: S:  I have not been able to do my therapy at work beacuse my step son is on a suicide watch. Pain Assessment Currently in Pain?: Yes Pain Score:   2 Pain Location: Shoulder Pain Orientation: Right Pain Type: Acute pain   Exercise/Treatments Supine Protraction: PROM;10 reps Horizontal ABduction: PROM;10 reps External Rotation: PROM;10 reps Internal Rotation: PROM;10 reps Flexion: PROM;10 reps ABduction: PROM;10 reps Seated Protraction: Strengthening;15 reps Protraction Weight (lbs): 3# Horizontal ABduction: Strengthening;15 reps Horizontal ABduction Weight (lbs): 3# External Rotation: Strengthening;Theraband;15 reps External Rotation Weight (lbs): 3# Internal Rotation: Strengthening;Theraband;15 reps Internal Rotation Weight (lbs): 3# Flexion: Strengthening;15 reps Flexion Weight (lbs): 3# Abduction: Strengthening;15 reps ABduction Weight (lbs): 3# Prone  Retraction: Strengthening;10 reps Retraction Weight (lbs): 3# Flexion: Strengthening;10 reps Flexion Weight (lbs): 3# Extension: Strengthening;10 reps Extension Weight (lbs): 3# Horizontal ABduction 1: Strengthening;10 reps Horizontal ABduction 1 Weight (lbs): 3# Horizontal ABduction 2: Strengthening;10 reps Horizontal ABduction 2 Weight (lbs): 3# ROM / Strengthening / Isometric Strengthening UBE (Upper Arm Bike): 3' and 3' with 3.5 Cybex Press: 3 plate;15 reps Cybex Row: 3 plate;15 reps Wall Wash: 5' with 2# Thumb Tacks: 1' "W" Arms: 3# x 15 X to V Arms: 3# x 15 Prot/Ret//Elev/Dep: 1'  Manual Therapy Manual Therapy: Myofascial release Myofascial Release: MFR and manual  stretching to decrease pain and fascial restrictions in right shoulder girlde region. Minimal fascial restrictions noted in his right shoulder region  Occupational Therapy Assessment and Plan OT Assessment and Plan Clinical Impression Statement: A:  Increased prone to 3# Rehab Potential: Good OT Plan: P:  Change wall wash to 3# for 2'.   Goals Short Term Goals Time to Complete Short Term Goals: 3 weeks Short Term Goal 1: Patient will be educated and I with HEP. Short Term Goal 2: Patient will increase right shoulder and left int/ext rotation to WNL AROM for increased ability to play with his kids. Short Term Goal 3: Patient will increase sustained activity tolerance in his right shoulder from fair + to good + for increased ability to complete work activities. Short Term Goal 4: Patient will increase right scapular stability from good to normal. Short Term Goal 5: Patient will decrease pain from 5/10 to 2/10 in his right shoulder while picking up his children. Additional Short Term Goals?: Yes Short Term Goal 6: Patient will decrease fascial restrictions in his right shoulder  and left internal/external rotator cuff from min-mod to trace. End of Session Patient Active Problem List  Diagnoses  . OBESITY  . HYPERTENSION  . SINUSITIS, ACUTE  . NASAL POLYP  . ALLERGIC RHINITIS  . SLEEP APNEA  . Sprain and strain of unspecified site of shoulder and upper arm  . Muscle weakness (generalized)  . Pain in joint, shoulder region   End of Session Activity Tolerance: Patient tolerated treatment well General Behavior During Session: [facility] for tasks performed Cognition: [facility] for tasks performed   Hazely Sealey L. [employer]ahoon, COTA/L  04/18/2011, 2:01 PM

## 2011-04-21 ENCOUNTER — Ambulatory Visit (HOSPITAL_COMMUNITY)
Admission: RE | Admit: 2011-04-21 | Discharge: 2011-04-21 | Disposition: A | Discharge status: Home / Self Care | Payer: BC Managed Care – PPO | Source: Ambulatory Visit | Attending: Orthopedic Surgery | Admitting: Orthopedic Surgery

## 2011-04-23 ENCOUNTER — Ambulatory Visit (HOSPITAL_COMMUNITY)
Admission: RE | Admit: 2011-04-23 | Discharge: 2011-04-23 | Disposition: A | Discharge status: Home / Self Care | Payer: BC Managed Care – PPO | Source: Ambulatory Visit | Attending: Orthopedic Surgery | Admitting: Orthopedic Surgery

## 2011-04-23 DIAGNOSIS — M25519 Pain in unspecified shoulder: Secondary | ICD-10-CM

## 2011-04-23 DIAGNOSIS — M6281 Muscle weakness (generalized): Secondary | ICD-10-CM

## 2011-04-23 DIAGNOSIS — IMO0002 Reserved for concepts with insufficient information to code with codable children: Secondary | ICD-10-CM

## 2011-04-23 NOTE — Progress Notes (Signed)
Occupational Therapy Treatment  Patient Details  Name: Marc Johnston MRN: [id number] Date of Birth: 27-Dec-1975  Today's Date: 04/23/2011 Time: 2440-1027 Time Calculation (min): 40 min Visit#: 9  of 14   Re-eval: 04/30/11 Manual Therapy [phone]  10' Therapeutic Exercise [phone] 29'    Subjective Symptoms/Limitations Symptoms: S:  I am still sore from the FCE the other day. Pain Assessment Currently in Pain?: Yes Pain Score:   4 Pain Location: Shoulder Pain Type: Acute pain  Exercise/Treatments Supine Protraction: PROM;10 reps Horizontal ABduction: PROM;10 reps External Rotation: PROM;10 reps Internal Rotation: PROM;10 reps Flexion: PROM;10 reps ABduction: PROM;10 reps Seated Protraction: Strengthening;15 reps Protraction Weight (lbs): 3# Horizontal ABduction: Strengthening;15 reps Horizontal ABduction Weight (lbs): 3# External Rotation: Strengthening;15 reps External Rotation Weight (lbs): 3# Internal Rotation: Strengthening;15 reps Internal Rotation Weight (lbs): 3# Flexion: Strengthening;15 reps Flexion Weight (lbs): 3# Abduction: Strengthening;15 reps Prone  Retraction: Strengthening;12 reps Retraction Weight (lbs): 3# Flexion: Strengthening;12 reps Flexion Weight (lbs): 3# Extension: Strengthening;12 reps Extension Weight (lbs): 3# Horizontal ABduction 1: Strengthening;12 reps Horizontal ABduction 1 Weight (lbs): 3# Horizontal ABduction 2: Strengthening;12 reps Horizontal ABduction 2 Weight (lbs): 3# ROM / Strengthening / Isometric Strengthening UBE (Upper Arm Bike): 3' and 3' with 4.0 Cybex Press: 3.5 plate;15 reps Cybex Row: 3.5 plate;15 reps Wall Wash: 2' with 3# Thumb Tacks: 1' "W" Arms: 3# x 15 X to V Arms: 3# x 15    Manual Therapy Manual Therapy: Myofascial release Myofascial Release: MFR and manual stretching to decrease pain and fascial restrictions in right shoulder girlde region. Minimal fascial restrictions noted in his right  shoulder region.  Occupational Therapy Assessment and Plan OT Assessment and Plan Clinical Impression Statement: A:  Increased wall wash to 2' with 3#. Rehab Potential: Good OT Plan: P: Increase seated to 4#   Goals Short Term Goals Time to Complete Short Term Goals: 3 weeks Short Term Goal 1: Patient will be educated and I with HEP. Short Term Goal 2: Patient will increase right shoulder and left int/ext rotation to WNL AROM for increased ability to play with his kids. Short Term Goal 3: Patient will increase sustained activity tolerance in his right shoulder from fair + to good + for increased ability to complete work activities. Short Term Goal 4: Patient will increase right scapular stability from good to normal. Short Term Goal 5: Patient will decrease pain from 5/10 to 2/10 in his right shoulder while picking up his children. Additional Short Term Goals?: Yes Short Term Goal 6: Patient will decrease fascial restrictions in his right shoulder  and left internal/external rotator cuff from min-mod to trace. End of Session Patient Active Problem List  Diagnoses  . OBESITY  . HYPERTENSION  . SINUSITIS, ACUTE  . NASAL POLYP  . ALLERGIC RHINITIS  . SLEEP APNEA  . Sprain and strain of unspecified site of shoulder and upper arm  . Muscle weakness (generalized)  . Pain in joint, shoulder region   End of Session Activity Tolerance: Patient tolerated treatment well General Behavior During Session: [facility] for tasks performed Cognition: [facility] for tasks performed    04/23/2011, 1:15 PM

## 2011-04-28 ENCOUNTER — Ambulatory Visit (HOSPITAL_COMMUNITY)
Admission: RE | Admit: 2011-04-28 | Discharge: 2011-04-28 | Disposition: A | Discharge status: Home / Self Care | Payer: BC Managed Care – PPO | Source: Ambulatory Visit | Attending: Orthopedic Surgery | Admitting: Orthopedic Surgery

## 2011-04-28 DIAGNOSIS — M25519 Pain in unspecified shoulder: Secondary | ICD-10-CM

## 2011-04-28 DIAGNOSIS — M6281 Muscle weakness (generalized): Secondary | ICD-10-CM

## 2011-04-28 DIAGNOSIS — IMO0002 Reserved for concepts with insufficient information to code with codable children: Secondary | ICD-10-CM
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
## 2011-04-30 ENCOUNTER — Ambulatory Visit (HOSPITAL_COMMUNITY)
Admission: RE | Admit: 2011-04-30 | Discharge: 2011-04-30 | Disposition: A | Discharge status: Home / Self Care | Payer: BC Managed Care – PPO | Source: Ambulatory Visit | Attending: Specialist | Admitting: Specialist

## 2011-04-30 DIAGNOSIS — IMO0002 Reserved for concepts with insufficient information to code with codable children: Secondary | ICD-10-CM

## 2011-04-30 DIAGNOSIS — M6281 Muscle weakness (generalized): Secondary | ICD-10-CM

## 2011-04-30 DIAGNOSIS — M25519 Pain in unspecified shoulder: Secondary | ICD-10-CM

## 2011-04-30 NOTE — Progress Notes (Signed)
Occupational Therapy Treatment  Patient Details  Name: Marc Johnston MRN: [id number] Date of Birth: 1975-06-01  Today's Date: 04/30/2011 Time: 1478-2956 Time Calculation (min): 56 min Visit#: 11  of 14   Re-eval: 04/30/11 Manual Therapy [phone] 18' Reassess [phone]  10' Therapeutic Exercise [id number]  27'    Subjective Symptoms/Limitations Symptoms: S:  That FCE tore me up.  I am still sore. It is not as stiff, still sore. Pain Assessment Currently in Pain?: Yes Pain Score:   5 Pain Location: Shoulder  Exercise/Treatments Supine Protraction: PROM;10 reps Horizontal ABduction: PROM;10 reps External Rotation: PROM;10 reps Internal Rotation: PROM;10 reps Flexion: PROM;10 reps ABduction: PROM;10 reps Seated Protraction: Strengthening;15 reps Protraction Weight (lbs): 3# Horizontal ABduction: Strengthening;15 reps Horizontal ABduction Weight (lbs): 3# External Rotation: Strengthening;15 reps External Rotation Weight (lbs): 3# Internal Rotation: Strengthening;15 reps Internal Rotation Weight (lbs): 3# Flexion: Strengthening;15 reps Flexion Weight (lbs): 3# Abduction: Strengthening;15 reps ABduction Weight (lbs): 3# Prone  Retraction: Strengthening;15 reps Retraction Weight (lbs): 3# Flexion: Strengthening;15 reps Flexion Weight (lbs): 3# Extension: Strengthening;15 reps Extension Weight (lbs): 3# Horizontal ABduction 1: Strengthening;15 reps Horizontal ABduction 1 Weight (lbs): 3# Horizontal ABduction 2: Strengthening;15 reps Horizontal ABduction 2 Weight (lbs): 3# ROM / Strengthening / Isometric Strengthening UBE (Upper Arm Bike): 3' and 3' with 4.0 Cybex Press: 3.5 plate;20 reps Cybex Row: 3.5 plate;20 reps Wall Wash: 3' with 3# Thumb Tacks: 1' "W" Arms: 3# x 15 X to V Arms: 3# x 15 Prot/Ret//Elev/Dep: 1'  Manual Therapy Manual Therapy: Myofascial release Myofascial Release: MFR and manual stretching to decrease pain and fascial restrictions in right  shoulder girlde region. Minimal fascial restrictions noted in his right shoulder region.  Occupational Therapy Assessment and Plan OT Assessment and Plan Clinical Impression Statement: A:  See progress note.  Added prone ex to hep. Rehab Potential: Good OT Plan: P:  D/C to HEP   Goals Short Term Goals Time to Complete Short Term Goals: 3 weeks Short Term Goal 1: Patient will be educated and I with HEP. Short Term Goal 1 Progress: Met Short Term Goal 2: Patient will increase right shoulder and left int/ext rotation to WNL AROM for increased ability to play with his kids. Short Term Goal 2 Progress: Met Short Term Goal 3: Patient will increase sustained activity tolerance in his right shoulder from fair + to good + for increased ability to complete work activities. Short Term Goal 3 Progress: Met Short Term Goal 4: Patient will increase right scapular stability from good to normal. Short Term Goal 5: Patient will decrease pain from 5/10 to 2/10 in his right shoulder while picking up his children. Short Term Goal 5 Progress: Not met Additional Short Term Goals?: Yes Short Term Goal 6: Patient will decrease fascial restrictions in his right shoulder  and left internal/external rotator cuff from min-mod to trace. End of Session Patient Active Problem List  Diagnoses  . OBESITY  . HYPERTENSION  . SINUSITIS, ACUTE  . NASAL POLYP  . ALLERGIC RHINITIS  . SLEEP APNEA  . Sprain and strain of unspecified site of shoulder and upper arm  . Muscle weakness (generalized)  . Pain in joint, shoulder region   End of Session Activity Tolerance: Patient tolerated treatment well General Behavior During Session: [facility] for tasks performed Cognition: [facility] for tasks performed   [name], COTA/L  04/30/2011, 10:36 AM

## 2012-07-31 ENCOUNTER — Ambulatory Visit: Payer: Self-pay | Admitting: Family Medicine

## 2012-07-31 VITALS — BP 138/88 | HR 76 | Temp 97.3°F | Resp 18

## 2012-07-31 DIAGNOSIS — Z0289 Encounter for other administrative examinations: Secondary | ICD-10-CM

## 2012-07-31 NOTE — Progress Notes (Signed)
  Subjective:    Patient ID: Marc Johnston, male    DOB: July 02, 1975, 37 y.o.   MRN: [id number]  HPI  37 year old male presents with: Needing a DOT physical examination. Has been out of work since July 2012. He tore his right rotator cuff and forearm 2012. He feels fine to go back to work now. He has past history of HTN, but he is not currently on any medication for blood pressure. Does not check his blood pressure regularly.   Review of Systems     Objective:   Physical Exam        Assessment & Plan:

## 2012-07-31 NOTE — Progress Notes (Signed)
[address]   [place], [place]  [address]   [phone]  Subjective:    Patient ID: Marc Johnston, male    DOB: [birth year], 37 y.o.   MRN: [id number]  HPI  37 year old male presents with: Needing a DOT physical examination.   R Shoulder pain:  Torn rotator cuff with bicep strain.  Performed at work.   Has been out of work since July 2012. He tore his right rotator cuff and forearm 2012. He feels fine to go back to work now.  S/p FCE with 30 pound limit with R arm.    He has past history of HTN, but he is not currently on any medication for blood pressure. Does not check his blood pressure regularly.   PCP:  [facility]ler in [place].  Review of Systems  Constitutional: Negative for fever, chills, diaphoresis, appetite change and fatigue.  HENT: Negative for hearing loss, ear pain, nosebleeds, congestion, sore throat, rhinorrhea, sneezing, mouth sores, postnasal drip, tinnitus and ear discharge.   Eyes: Negative for photophobia, pain, discharge, redness, itching and visual disturbance.  Respiratory: Negative for apnea, cough, choking, chest tightness, shortness of breath, wheezing and stridor.   Cardiovascular: Negative for chest pain, palpitations and leg swelling.  Gastrointestinal: Negative for nausea, vomiting, abdominal pain, diarrhea, constipation, blood in stool, abdominal distention, anal bleeding and rectal pain.  Endocrine: Negative for polydipsia, polyphagia and polyuria.  Genitourinary: Negative for dysuria, urgency, frequency, hematuria, flank pain, penile swelling, scrotal swelling, penile pain and testicular pain.  Musculoskeletal: Positive for arthralgias. Negative for myalgias, back pain, joint swelling and gait problem.  Skin: Negative for color change, pallor, rash and wound.  Neurological: Negative for dizziness, tremors, seizures, syncope, facial asymmetry, speech difficulty, weakness, light-headedness, numbness and headaches.  Hematological: Negative for  adenopathy. Does not bruise/bleed easily.  Psychiatric/Behavioral: Positive for sleep disturbance. Negative for suicidal ideas, hallucinations, self-injury, dysphoric mood and decreased concentration. The patient is not nervous/anxious and is not hyperactive.         Past Medical History  Diagnosis Date  . Allergy   . Hypertension     HCTZ 2005-2008.  [name] OSA (obstructive sleep apnea) 05/05/2006    Mild.  No CPAP warranted.    Past Surgical History  Procedure Laterality Date  . Nasal sinus surgery      Prior to Admission medications   Not on File    Allergies  Allergen Reactions  . Guaifenesin     History   Social History  . Marital Status: Married    Spouse Name: N/A    Number of Children: N/A  . Years of Education: N/A   Occupational History  . Not on file.   Social History Main Topics  . Smoking status: Former Smoker    Quit date: 08/01/2007  . Smokeless tobacco: Former Neurosurgeon  . Alcohol Use: No  . Drug Use: No  . Sexually Active: Yes   Other Topics Concern  . Not on file   Social History Narrative   Marital status: married      Children:  Two children (3, 2)      Lives: with wife, two children, stepson.      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        Tobacco:  Quit smoking 2008.      Alcohol: none      Drugs: none      Exercise: none          Family History  Problem Relation Age of Onset  . Diabetes Father     Objective:   Physical Exam  Nursing note and vitals reviewed. Constitutional: He is oriented to person, place, and time. He appears well-developed and well-nourished. No distress.  HENT:  Head: Normocephalic and atraumatic.  Right Ear: External ear normal.  Left Ear: External ear normal.  Nose: Nose normal.  Mouth/Throat: Oropharynx is clear and moist.  Eyes: Conjunctivae and EOM are normal. Pupils are equal, round, and reactive to light.  Neck: Normal range of motion. Neck supple. No JVD present. No thyromegaly present.    Cardiovascular: Normal rate, regular rhythm, normal heart sounds and intact distal pulses.  Exam reveals no gallop and no friction rub.   No murmur heard. Pulmonary/Chest: Effort normal and breath sounds normal. No respiratory distress. He has no wheezes. He has no rales.  Abdominal: Soft. Bowel sounds are normal. He exhibits no distension and no mass. There is no tenderness. There is no rebound and no guarding. Hernia confirmed negative in the right inguinal area and confirmed negative in the left inguinal area.  Genitourinary: Penis normal. Right testis shows no mass, no swelling and no tenderness. Left testis shows no mass, no swelling and no tenderness.  Musculoskeletal:       Right shoulder: Normal.       Left shoulder: Normal.       Cervical back: Normal.       Thoracic back: Normal.       Lumbar back: Normal.  Lymphadenopathy:    He has no cervical adenopathy.       Right: No inguinal adenopathy present.       Left: No inguinal adenopathy present.  Neurological: He is alert and oriented to person, place, and time. He has normal reflexes. No cranial nerve deficit. He exhibits normal muscle tone. Coordination normal.  Skin: Skin is warm and dry. No rash noted. He is not diaphoretic.  Multiple tattoos.  Psychiatric: He has a normal mood and affect. His behavior is normal. Judgment and thought content normal.       Assessment & Plan:  Health examination of defined subpopulation   1.  DOT CPE:  Two year card provided.  Normal vision with corrective lens; normal hearing; normal u/a; normal peripheral vision and color vision. 2.  R rotator cuff tear: stable; full range of motion without weakness of RUE.

## 2012-08-21 ENCOUNTER — Encounter: Payer: Self-pay | Admitting: Family Medicine

## 2013-01-01 ENCOUNTER — Emergency Department (HOSPITAL_COMMUNITY)
Admission: EM | Admit: 2013-01-01 | Discharge: 2013-01-02 | Disposition: A | Discharge status: Home / Self Care | Payer: BC Managed Care – PPO | Attending: Emergency Medicine | Admitting: Emergency Medicine

## 2013-01-01 ENCOUNTER — Emergency Department (HOSPITAL_COMMUNITY): Payer: BC Managed Care – PPO

## 2013-01-01 ENCOUNTER — Encounter (HOSPITAL_COMMUNITY): Payer: Self-pay | Admitting: Radiology

## 2013-01-01 DIAGNOSIS — I10 Essential (primary) hypertension: Secondary | ICD-10-CM | POA: Insufficient documentation

## 2013-01-01 DIAGNOSIS — R079 Chest pain, unspecified: Secondary | ICD-10-CM | POA: Insufficient documentation

## 2013-01-01 DIAGNOSIS — R109 Unspecified abdominal pain: Secondary | ICD-10-CM

## 2013-01-01 DIAGNOSIS — M25511 Pain in right shoulder: Secondary | ICD-10-CM

## 2013-01-01 DIAGNOSIS — S4980XA Other specified injuries of shoulder and upper arm, unspecified arm, initial encounter: Secondary | ICD-10-CM | POA: Insufficient documentation

## 2013-01-01 DIAGNOSIS — Y939 Activity, unspecified: Secondary | ICD-10-CM | POA: Insufficient documentation

## 2013-01-01 DIAGNOSIS — Y92410 Unspecified street and highway as the place of occurrence of the external cause: Secondary | ICD-10-CM | POA: Insufficient documentation

## 2013-01-01 DIAGNOSIS — Z888 Allergy status to other drugs, medicaments and biological substances status: Secondary | ICD-10-CM | POA: Insufficient documentation

## 2013-01-01 DIAGNOSIS — Z87891 Personal history of nicotine dependence: Secondary | ICD-10-CM | POA: Insufficient documentation

## 2013-01-01 DIAGNOSIS — Z9109 Other allergy status, other than to drugs and biological substances: Secondary | ICD-10-CM | POA: Insufficient documentation

## 2013-01-01 DIAGNOSIS — S46909A Unspecified injury of unspecified muscle, fascia and tendon at shoulder and upper arm level, unspecified arm, initial encounter: Secondary | ICD-10-CM | POA: Insufficient documentation

## 2013-01-01 DIAGNOSIS — G4733 Obstructive sleep apnea (adult) (pediatric): Secondary | ICD-10-CM | POA: Insufficient documentation

## 2013-01-01 DIAGNOSIS — R51 Headache: Secondary | ICD-10-CM | POA: Insufficient documentation

## 2013-01-01 DIAGNOSIS — R1011 Right upper quadrant pain: Secondary | ICD-10-CM | POA: Insufficient documentation

## 2013-01-01 LAB — CBC
HCT: 43.5 % (ref 39.0–52.0)
Hemoglobin: 15.1 g/dL (ref 13.0–17.0)
MCH: 30 pg (ref 26.0–34.0)
MCV: 86.3 fL (ref 78.0–100.0)
RBC: 5.04 MIL/uL (ref 4.22–5.81)

## 2013-01-01 MED ORDER — IOHEXOL 300 MG/ML  SOLN
100.0000 mL | Freq: Once | INTRAMUSCULAR | Status: AC | PRN
  Administered 2013-01-01: 100 mL via INTRAVENOUS

## 2013-01-01 MED ORDER — MORPHINE SULFATE 4 MG/ML IJ SOLN
4.0000 mg | Freq: Once | INTRAMUSCULAR | Status: AC
  Administered 2013-01-02: 4 mg via INTRAVENOUS
  Filled 2013-01-01: qty 1

## 2013-01-01 NOTE — ED Notes (Signed)
Pt is unable to give urine specimen at this time. The patient has been advised to use call light for assistance. The RN in charge is aware.

## 2013-01-01 NOTE — ED Notes (Signed)
Pt. Feels like he is going to pass out. o2 on, encouraged to take deep breaths. Feels dehydrated.
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
## 2013-01-02 ENCOUNTER — Emergency Department (HOSPITAL_COMMUNITY): Payer: BC Managed Care – PPO

## 2013-01-02 LAB — BASIC METABOLIC PANEL
CO2: 20 mEq/L (ref 19–32)
Calcium: 9.4 mg/dL (ref 8.4–10.5)
Glucose, Bld: 112 mg/dL — ABNORMAL HIGH (ref 70–99)
Sodium: 141 mEq/L (ref 135–145)

## 2013-01-02 LAB — TYPE AND SCREEN: Antibody Screen: NEGATIVE

## 2013-01-02 MED ORDER — HYDROMORPHONE HCL PF 1 MG/ML IJ SOLN
1.0000 mg | Freq: Once | INTRAMUSCULAR | Status: AC
  Administered 2013-01-02: 1 mg via INTRAMUSCULAR
  Filled 2013-01-02: qty 1

## 2013-01-02 MED ORDER — ONDANSETRON HCL 4 MG/2ML IJ SOLN
4.0000 mg | Freq: Once | INTRAMUSCULAR | Status: AC
  Administered 2013-01-02: 4 mg via INTRAVENOUS
  Filled 2013-01-02: qty 2

## 2013-01-02 MED ORDER — OXYCODONE-ACETAMINOPHEN 5-325 MG PO TABS: 2.0000 | ORAL_TABLET | ORAL | Status: DC | PRN

## 2013-01-02 NOTE — Progress Notes (Signed)
Orthopedic Tech Progress Note Patient Details:  Marc Johnston 1975/10/12 [id number]  Ortho Devices Type of Ortho Device: Arm sling   Haskell Flirt 01/02/2013, 4:05 AM

## 2014-07-31 ENCOUNTER — Encounter (HOSPITAL_COMMUNITY): Payer: Self-pay | Admitting: Emergency Medicine

## 2014-07-31 ENCOUNTER — Observation Stay (HOSPITAL_COMMUNITY)
Admission: EM | Admit: 2014-07-31 | Discharge: 2014-08-01 | Disposition: A | Discharge status: Home / Self Care | Payer: Managed Care, Other (non HMO) | Attending: Internal Medicine | Admitting: Internal Medicine

## 2014-07-31 ENCOUNTER — Emergency Department (HOSPITAL_COMMUNITY): Payer: Managed Care, Other (non HMO)

## 2014-07-31 ENCOUNTER — Observation Stay (HOSPITAL_COMMUNITY): Payer: Managed Care, Other (non HMO)

## 2014-07-31 DIAGNOSIS — G4733 Obstructive sleep apnea (adult) (pediatric): Secondary | ICD-10-CM | POA: Diagnosis not present

## 2014-07-31 DIAGNOSIS — R079 Chest pain, unspecified: Secondary | ICD-10-CM | POA: Diagnosis not present

## 2014-07-31 DIAGNOSIS — I10 Essential (primary) hypertension: Secondary | ICD-10-CM | POA: Diagnosis not present

## 2014-07-31 DIAGNOSIS — R519 Headache, unspecified: Secondary | ICD-10-CM

## 2014-07-31 DIAGNOSIS — Z87891 Personal history of nicotine dependence: Secondary | ICD-10-CM | POA: Insufficient documentation

## 2014-07-31 DIAGNOSIS — K045 Chronic apical periodontitis: Secondary | ICD-10-CM | POA: Diagnosis not present

## 2014-07-31 DIAGNOSIS — G4482 Headache associated with sexual activity: Secondary | ICD-10-CM | POA: Insufficient documentation

## 2014-07-31 DIAGNOSIS — J329 Chronic sinusitis, unspecified: Secondary | ICD-10-CM | POA: Insufficient documentation

## 2014-07-31 DIAGNOSIS — R51 Headache: Secondary | ICD-10-CM

## 2014-07-31 DIAGNOSIS — R002 Palpitations: Secondary | ICD-10-CM | POA: Diagnosis present

## 2014-07-31 HISTORY — DX: Chronic apical periodontitis: K04.5

## 2014-07-31 LAB — CBC WITH DIFFERENTIAL/PLATELET
BASOS ABS: 0 10*3/uL (ref 0.0–0.1)
BASOS PCT: 0 % (ref 0–1)
EOS PCT: 1 % (ref 0–5)
Eosinophils Absolute: 0.1 10*3/uL (ref 0.0–0.7)
HCT: 43.4 % (ref 39.0–52.0)
HEMOGLOBIN: 14.4 g/dL (ref 13.0–17.0)
Lymphocytes Relative: 28 % (ref 12–46)
Lymphs Abs: 2.2 10*3/uL (ref 0.7–4.0)
MCH: 29.6 pg (ref 26.0–34.0)
MCHC: 33.2 g/dL (ref 30.0–36.0)
MCV: 89.1 fL (ref 78.0–100.0)
MONO ABS: 0.6 10*3/uL (ref 0.1–1.0)
Monocytes Relative: 7 % (ref 3–12)
Neutro Abs: 4.9 10*3/uL (ref 1.7–7.7)
Neutrophils Relative %: 64 % (ref 43–77)
Platelets: 139 10*3/uL — ABNORMAL LOW (ref 150–400)
RBC: 4.87 MIL/uL (ref 4.22–5.81)
RDW: 12.9 % (ref 11.5–15.5)
WBC: 7.8 10*3/uL (ref 4.0–10.5)

## 2014-07-31 LAB — TROPONIN I: Troponin I: <0.03 ng/mL (ref <0.031)

## 2014-07-31 LAB — I-STAT CHEM 8, ED
BUN: 10 mg/dL (ref 6–23)
CALCIUM ION: 1.22 mmol/L (ref 1.12–1.23)
CHLORIDE: 105 mmol/L (ref 96–112)
CREATININE: 0.8 mg/dL (ref 0.50–1.35)
GLUCOSE: 103 mg/dL — AB (ref 70–99)
HCT: 44 % (ref 39.0–52.0)
Hemoglobin: 15 g/dL (ref 13.0–17.0)
Potassium: 3.6 mmol/L (ref 3.5–5.1)
Sodium: 141 mmol/L (ref 135–145)
TCO2: 21 mmol/L (ref 0–100)

## 2014-07-31 LAB — LIPID PANEL
CHOLESTEROL: 161 mg/dL (ref 0–200)
HDL: 44 mg/dL (ref >39)
LDL Cholesterol: 89 mg/dL (ref 0–99)
Total CHOL/HDL Ratio: 3.7 RATIO
Triglycerides: 142 mg/dL (ref <150)
VLDL: 28 mg/dL (ref 0–40)

## 2014-07-31 LAB — I-STAT TROPONIN, ED: TROPONIN I, POC: 0 ng/mL (ref 0.00–0.08)

## 2014-07-31 LAB — BRAIN NATRIURETIC PEPTIDE: B Natriuretic Peptide: 10 pg/mL (ref 0.0–100.0)

## 2014-07-31 LAB — TSH: TSH: 1.276 u[IU]/mL (ref 0.350–4.500)

## 2014-07-31 MED ORDER — ASPIRIN EC 81 MG PO TBEC
81.0000 mg | DELAYED_RELEASE_TABLET | Freq: Every day | ORAL | Status: DC
  Administered 2014-08-01: 81 mg via ORAL
  Filled 2014-07-31: qty 1

## 2014-07-31 MED ORDER — SODIUM CHLORIDE 0.9 % IJ SOLN
3.0000 mL | Freq: Two times a day (BID) | INTRAMUSCULAR | Status: DC
  Administered 2014-07-31 – 2014-08-01 (×2): 3 mL via INTRAVENOUS

## 2014-07-31 MED ORDER — PANTOPRAZOLE SODIUM 40 MG PO TBEC
40.0000 mg | DELAYED_RELEASE_TABLET | Freq: Every day | ORAL | Status: DC
  Administered 2014-08-01: 40 mg via ORAL
  Filled 2014-07-31: qty 1

## 2014-07-31 MED ORDER — ASPIRIN 81 MG PO CHEW
CHEWABLE_TABLET | ORAL | Status: AC
  Filled 2014-07-31: qty 1

## 2014-07-31 MED ORDER — ASPIRIN 81 MG PO CHEW
324.0000 mg | CHEWABLE_TABLET | Freq: Once | ORAL | Status: AC
  Administered 2014-07-31: 324 mg via ORAL
  Filled 2014-07-31: qty 4

## 2014-07-31 MED ORDER — HEPARIN SODIUM (PORCINE) 5000 UNIT/ML IJ SOLN
5000.0000 [IU] | Freq: Three times a day (TID) | INTRAMUSCULAR | Status: DC
  Administered 2014-07-31 – 2014-08-01 (×2): 5000 [IU] via SUBCUTANEOUS
  Filled 2014-07-31 (×3): qty 1
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
## 2014-07-31 NOTE — ED Provider Notes (Signed)
CSN: [id number]     Arrival date & time 07/31/14  1727 History   First MD Initiated Contact with Patient 07/31/14 1743     Chief Complaint  Patient presents with  . Palpitations     (Consider location/radiation/quality/duration/timing/severity/associated sxs/prior Treatment) HPI  Past Medical History  Diagnosis Date  . Allergy   . Hypertension     HCTZ 2005-2008.  [name]. OSA (obstructive sleep apnea) 05/05/2006    Mild.  No CPAP warranted.   Past Surgical History  Procedure Laterality Date  . Nasal sinus surgery     Family History  Problem Relation Age of Onset  . Diabetes Father    History  Substance Use Topics  . Smoking status: Former Smoker    Quit date: 08/01/2007  . Smokeless tobacco: Former NeurosurgeonUser  . Alcohol Use: No    Review of Systems    Allergies  Guaifenesin  Home Medications   Prior to Admission medications   Medication Sig Start Date End Date Taking? Authorizing Provider  oxyCODONE-acetaminophen (PERCOCET/ROXICET) 5-325 MG per tablet Take 2 tablets by mouth every 4 (four) hours as needed for pain. 01/02/13   Kristen N Ward, DO   BP 160/90 mmHg  Pulse 72  Temp(Src) 98 F (36.7 C) (Oral)  Resp 18  Ht 5\' 10"  (1.778 m)  Wt 220 lb (99.791 kg)  BMI 31.57 kg/m2  SpO2 98% Physical Exam  ED Course  Procedures (including critical care time) Labs Review Labs Reviewed - No data to display  Imaging Review No results found.   EKG Interpretation   Date/Time:  Monday July 31 2014 17:39:37 EDT Ventricular Rate:  67 PR Interval:  146 QRS Duration: 86 QT Interval:  368 QTC Calculation: 388 R Axis:   47 Text Interpretation:  Normal sinus rhythm Nonspecific T wave abnormality  Abnormal ECG No significant change since last tracing Confirmed by  Ethelda ChickJACUBOWITZ  MD, Cleburne Savini [phone]([id number]) on 07/31/2014 5:42:44 PM      MDM   Final diagnoses:  None    Please cancel. Duplicate note.    Doug SouSam Austan Nicholl, MD 08/01/14 [phone]

## 2014-07-31 NOTE — ED Notes (Signed)
Pt sent from Dr. Sharyon MedicusFusco's office. Pt reports chest discomfort x2 days, headache x1 week. Pt alert and oriented. nad noted.
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
## 2014-08-01 ENCOUNTER — Encounter (HOSPITAL_COMMUNITY): Payer: Self-pay | Admitting: Radiology

## 2014-08-01 ENCOUNTER — Observation Stay (HOSPITAL_COMMUNITY): Payer: Managed Care, Other (non HMO)

## 2014-08-01 DIAGNOSIS — G4482 Headache associated with sexual activity: Secondary | ICD-10-CM | POA: Diagnosis not present

## 2014-08-01 DIAGNOSIS — J329 Chronic sinusitis, unspecified: Secondary | ICD-10-CM | POA: Diagnosis not present

## 2014-08-01 DIAGNOSIS — R079 Chest pain, unspecified: Secondary | ICD-10-CM | POA: Insufficient documentation

## 2014-08-01 LAB — CBC WITH DIFFERENTIAL/PLATELET
BASOS PCT: 0 % (ref 0–1)
Basophils Absolute: 0 10*3/uL (ref 0.0–0.1)
Eosinophils Absolute: 0.1 10*3/uL (ref 0.0–0.7)
Eosinophils Relative: 2 % (ref 0–5)
HEMATOCRIT: 44.6 % (ref 39.0–52.0)
Hemoglobin: 15 g/dL (ref 13.0–17.0)
LYMPHS PCT: 39 % (ref 12–46)
Lymphs Abs: 2.6 10*3/uL (ref 0.7–4.0)
MCH: 29.9 pg (ref 26.0–34.0)
MCHC: 33.6 g/dL (ref 30.0–36.0)
MCV: 89 fL (ref 78.0–100.0)
MONO ABS: 0.5 10*3/uL (ref 0.1–1.0)
Monocytes Relative: 8 % (ref 3–12)
NEUTROS PCT: 51 % (ref 43–77)
Neutro Abs: 3.4 10*3/uL (ref 1.7–7.7)
PLATELETS: 126 10*3/uL — AB (ref 150–400)
RBC: 5.01 MIL/uL (ref 4.22–5.81)
RDW: 13 % (ref 11.5–15.5)
WBC: 6.6 10*3/uL (ref 4.0–10.5)

## 2014-08-01 LAB — COMPREHENSIVE METABOLIC PANEL
ALT: 35 U/L (ref 0–53)
AST: 24 U/L (ref 0–37)
Albumin: 3.8 g/dL (ref 3.5–5.2)
Alkaline Phosphatase: 84 U/L (ref 39–117)
Anion gap: 5 (ref 5–15)
BUN: 11 mg/dL (ref 6–23)
CHLORIDE: 109 mmol/L (ref 96–112)
CO2: 27 mmol/L (ref 19–32)
Calcium: 9 mg/dL (ref 8.4–10.5)
Creatinine, Ser: 0.89 mg/dL (ref 0.50–1.35)
GFR calc Af Amer: >90 mL/min (ref >90)
Glucose, Bld: 118 mg/dL — ABNORMAL HIGH (ref 70–99)
Potassium: 3.5 mmol/L (ref 3.5–5.1)
Sodium: 141 mmol/L (ref 135–145)
Total Bilirubin: 0.5 mg/dL (ref 0.3–1.2)
Total Protein: 6.8 g/dL (ref 6.0–8.3)

## 2014-08-01 LAB — TROPONIN I
Troponin I: <0.03 ng/mL (ref <0.031)
Troponin I: <0.03 ng/mL (ref <0.031)

## 2014-08-01 MED ORDER — AZITHROMYCIN 250 MG PO TABS: ORAL_TABLET | ORAL | Status: DC

## 2014-08-01 MED ORDER — IOHEXOL 350 MG/ML SOLN
80.0000 mL | Freq: Once | INTRAVENOUS | Status: AC | PRN
  Administered 2014-08-01: 80 mL via INTRAVENOUS

## 2014-08-01 MED ORDER — PANTOPRAZOLE SODIUM 40 MG PO TBEC: 40.0000 mg | DELAYED_RELEASE_TABLET | Freq: Every day | ORAL | Status: DC

## 2014-08-01 NOTE — Progress Notes (Signed)
1503 D/C instructions, paperwork and hard Rx given to patient and significant other. IV catheter removed from RIGHT AC, catheter tip intact, no s/s of infection noted. Tele monitor removed from patient and central tele notified and made aware.

## 2014-08-01 NOTE — Progress Notes (Signed)
Nutrition Brief Note  Patient had asked about heart healthy eating  Wt Readings from Last 15 Encounters:  07/31/14 220 lb (99.791 kg)  07/31/14 220 lb (99.791 kg)  01/01/13 230 lb (104.327 kg)    Body mass index is 31.57 kg/(m^2). Patient meets criteria for obese based on current BMI.   Gave handout "Heart Healthy Nutrition Therapy". Talked about how to reduce sodium/saturated fats in diet.   At this time patient does not seem to be ready for a change. Talked about very small steps he could take.  No nutrition interventions warranted at this time. If nutrition issues arise, please consult RD.   Christophe LouisNathan Franks RD, LDN Nutrition Pager: [phone] 08/01/2014 12:54 PM

## 2014-08-01 NOTE — Progress Notes (Signed)
UR completed 

## 2014-08-01 NOTE — Discharge Summary (Signed)
Physician Discharge Summary  Marc Johnston:[id number] DOB: 1976-01-24 DOA: 07/31/2014  PCP: Marc Johnston., MD  Admit date: 07/31/2014 Discharge date: 08/01/2014  Time spent: 40 mintues minutes  Recommendations for Outpatient Follow-up:  1. Follow up with PCP as scheduled for follow up headache, may benefit ENT referral chronic sinusitis as well as periapical granuloma of left lateral mandible incisor   Discharge Diagnoses:  Active Problems:   Chest pain   Headache   Chest pain at rest   Headache associated with sexual activity   Sinusitis   Discharge Condition: stable chest pain free Diet recommendation: heart healthy  Filed Weights   07/31/14 1734 07/31/14 2056  Weight: 99.791 kg (220 lb) 99.791 kg (220 lb)    History of present illness:  This is a 39 y.o. year old male with significant past medical history of HTN, OSA presented to ED on 08/01/14 with chest pain, headache. Pt reported 1-2 episodes mild central chest pain over the prior 2 days. Pain 2-3/10 at its worst. Denied any alleviating, aggravating factors. Had one episode assd w/ eating. Had mild ? Diaphoresis/?presyncope that self resolved. Episode lasted approx 30 mins and self resolved. No known hx/o GERD. Pt non smoker. No known hx/o cardiac disease. + family cardiac disease in mother and father.  Pt also reported episodes of postcoital headache. This had been ongoing for 1-2 weeks. Denied any hx/o headache in the past. No vision change, hemiparesis, confusion. Sxs self resolve w/in minutes.  Presented to ER afebrile, HR 70s, BP 160/90. Satting well on RA. CBC and CMET grossly WNL.  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  HEART score 2-3   Hospital Course:  - Chest Pain  -mild and fairly atypical.trop neg x3. EKG WNL , TSH, lipid panel within limits of normal. hgA1c pending at discharge. No further chest pain after admission. Tele without event. Patient reports  feels CP related to anxiety about chronic headaches.    2- Headache  -Intermittent and chronic worse during spring and fall. Improved at discharge. Hx sinus surgery. CT Angio head negative CTA of the head and neck. Large periapical granuloma around the left mandible lateral incisor. MRI negative. Will discharge with zpack. Has appointment with PCP. Recommend ENT referral   3. Granuloma left mandible lateral incisor.  Incidental finding on CT . No issues with swallowing. OP follow up with ENT  Procedures:  Consultations:  none  Discharge Exam: Filed Vitals:   08/01/14 0538  BP: 131/67  Pulse: 68  Temp: 98.7 F (37.1 C)  Resp: 16    General: well nourished appears comfortable Cardiovascular: RRR no MGR no LE edema Respiratory: normal effort BS clear bilaterally no wheeze  Discharge Instructions   Discharge Instructions    Diet - low sodium heart healthy    Complete by:  As directed      Increase activity slowly    Complete by:  As directed           Current Discharge Medication List    START taking these medications   Details  azithromycin (ZITHROMAX Z-PAK) 250 MG tablet 500 mg on day one then  daily until complete. Qty: 6 each, Refills: 0    pantoprazole (PROTONIX) 40 MG tablet Take 1 tablet (40 mg total) by mouth daily. Qty: 30 tablet, Refills: 1      CONTINUE these medications which have NOT CHANGED   Details  ibuprofen (ADVIL,MOTRIN) 200 MG tablet Take 200  mg by mouth every 6 (six) hours as needed for headache, mild pain or moderate pain.    Multiple Vitamins-Minerals (MENS ONE DAILY PO) Take 1 tablet by mouth daily.    oxyCODONE-acetaminophen (PERCOCET/ROXICET) 5-325 MG per tablet Take 2 tablets by mouth every 4 (four) hours as needed for pain. Qty: 15 tablet, Refills: 0       Allergies  Allergen Reactions  . Morphine Other (See Comments)    Severe agitation  . Guaifenesin Other (See Comments)    unknown   Follow-up Information    Follow  up with Marc Johnston., MD On 08/18/2014.   Specialty:  Internal Medicine   Why:  FOLLOW-UP APPT ON FRIDAY [id number] AT 11:45 PLEASE BRING ALL YOUR MEDICATION   Contact information:   [address] [place] [address] [phone]        The results of significant diagnostics from this hospitalization (including imaging, microbiology, ancillary and laboratory) are listed below for reference.    Significant Diagnostic Studies: Ct Angio Head W/cm &/or Wo Cm  08/01/2014   CLINICAL DATA:  Postcoital headache for 2 years, recently worsening.  EXAM: CT ANGIOGRAPHY HEAD AND NECK  TECHNIQUE: Multidetector CT imaging of the head and neck was performed using the standard protocol during bolus administration of intravenous contrast. Multiplanar CT image reconstructions and MIPs were obtained to evaluate the vascular anatomy. Carotid stenosis measurements (when applicable) are obtained utilizing NASCET criteria, using the distal internal carotid diameter as the denominator.  CONTRAST:  80mL OMNIPAQUE IOHEXOL 350 MG/ML SOLN  COMPARISON:  Brain MRI from 1 day prior  FINDINGS: CT HEAD  Brain: No evidence of acute infarction, hemorrhage, hydrocephalus, or mass lesion/mass effect. No abnormal enhancement on delayed imaging.  Skull and Sinuses:Mild mucosal thickening in the inferior left maxillary sinus.  Orbits: Negative.  CTA NECK  Aortic arch: Normal. Visible portions without aneurysm or dissection.  Right carotid system: Limited evaluation of the brachiocephalic artery due to the streak artifact from the intravenous contrast. No carotid atherosclerosis, stenosis, or dissection.  Left carotid system: No carotid atherosclerosis, stenosis, or dissection.  Vertebral arteries:The bilateral subclavian arteries are widely patent. Small vertebral arteries in the setting of sizable posterior communicating arteries. The right vertebral artery is dominant. There is no evidence of atherosclerosis, stenosis, or  dissection.  Other neck: Large periapical erosion around the left maxillary lateral incisor.  CTA HEAD  Evaluation of small and medium vessels is limited by venous contamination.  Anterior circulation: Balanced anterior circulation. Present anterior communicating artery with accessory A2 branch which is small. There is no evidence of vasculopathy, central stenosis, or major vessel occlusion. No aneurysm.  Posterior circulation: Right dominance with small basilar in the setting of large posterior communicating arteries. Symmetric and normal appearing PICAs and AICAs. Superior cerebellar arteries are also symmetric. Congenitally diminutive P1 segments. No superimposed stenosis or vasculopathy noted.  Venous sinuses: Patent  Anatomic variants: Fetal type PCAs bilaterally.  Delayed phase: Negative.  IMPRESSION: 1. Negative CTA of the head and neck. 2. Large periapical granuloma around the left mandible lateral incisor.   Electronically Signed   By: Marnee Spring M.D.   On: 08/01/2014 11:18   Ct Angio Neck W/cm &/or Wo/cm  08/01/2014   CLINICAL DATA:  Postcoital headache for 2 years, recently worsening.  EXAM: CT ANGIOGRAPHY HEAD AND NECK  TECHNIQUE: Multidetector CT imaging of the head and neck was performed using the standard protocol during bolus administration of intravenous contrast. Multiplanar CT image reconstructions and MIPs were  obtained to evaluate the vascular anatomy. Carotid stenosis measurements (when applicable) are obtained utilizing NASCET criteria, using the distal internal carotid diameter as the denominator.  CONTRAST:  80mL OMNIPAQUE IOHEXOL 350 MG/ML SOLN  COMPARISON:  Brain MRI from 1 day prior  FINDINGS: CT HEAD  Brain: No evidence of acute infarction, hemorrhage, hydrocephalus, or mass lesion/mass effect. No abnormal enhancement on delayed imaging.  Skull and Sinuses:Mild mucosal thickening in the inferior left maxillary sinus.  Orbits: Negative.  CTA NECK  Aortic arch: Normal. Visible  portions without aneurysm or dissection.  Right carotid system: Limited evaluation of the brachiocephalic artery due to the streak artifact from the intravenous contrast. No carotid atherosclerosis, stenosis, or dissection.  Left carotid system: No carotid atherosclerosis, stenosis, or dissection.  Vertebral arteries:The bilateral subclavian arteries are widely patent. Small vertebral arteries in the setting of sizable posterior communicating arteries. The right vertebral artery is dominant. There is no evidence of atherosclerosis, stenosis, or dissection.  Other neck: Large periapical erosion around the left maxillary lateral incisor.  CTA HEAD  Evaluation of small and medium vessels is limited by venous contamination.  Anterior circulation: Balanced anterior circulation. Present anterior communicating artery with accessory A2 branch which is small. There is no evidence of vasculopathy, central stenosis, or major vessel occlusion. No aneurysm.  Posterior circulation: Right dominance with small basilar in the setting of large posterior communicating arteries. Symmetric and normal appearing PICAs and AICAs. Superior cerebellar arteries are also symmetric. Congenitally diminutive P1 segments. No superimposed stenosis or vasculopathy noted.  Venous sinuses: Patent  Anatomic variants: Fetal type PCAs bilaterally.  Delayed phase: Negative.  IMPRESSION: 1. Negative CTA of the head and neck. 2. Large periapical granuloma around the left mandible lateral incisor.   Electronically Signed   By: Marnee SpringJonathon  Watts M.D.   On: 08/01/2014 11:18   Mr Brain Wo Contrast  07/31/2014   CLINICAL DATA:  Recent headaches. Near syncope/ lightheadedness. Memory disturbance.  EXAM: MRI HEAD WITHOUT CONTRAST  TECHNIQUE: Multiplanar, multiecho pulse sequences of the brain and surrounding structures were obtained without intravenous contrast.  COMPARISON:  Head CT 01/01/2013  FINDINGS: The brain has a normal appearance on all pulse sequences  without evidence of malformation, atrophy, old or acute infarction, mass lesion, hemorrhage, hydrocephalus or extra-axial collection. No pituitary mass. Mild mucosal inflammation affects the left maxillary sinus. No skull or skullbase lesion. There is flow in the major vessels at the base of the brain. Major venous sinuses show flow.  IMPRESSION: Normal study of the brain. Mild mucosal inflammation of the left maxillary sinus.   Electronically Signed   By: Paulina FusiMark  Shogry M.D.   On: 07/31/2014 20:41   Dg Chest Portable 1 View  07/31/2014   CLINICAL DATA:  Acute chest pain  EXAM: PORTABLE CHEST - 1 VIEW  COMPARISON:  None in the  FINDINGS: Minor right base subsegmental atelectasis. Lungs otherwise clear. No focal pneumonia, collapse or consolidation. No edema, effusion or pneumothorax. Trachea midline. Normal heart size and vascularity.  IMPRESSION: Right base atelectasis.  No other acute finding.   Electronically Signed   By: Judie PetitM.  Shick M.D.   On: 07/31/2014 20:06    Microbiology: No results found for this or any previous visit (from the past 240 hour(s)).   Labs: Basic Metabolic Panel:  Recent Labs Lab 07/31/14 1822 08/01/14 0202  NA 141 141  K 3.6 3.5  CL 105 109  CO2  --  27  GLUCOSE 103* 118*  BUN 10 11  CREATININE  0.80 0.89  CALCIUM  --  9.0   Liver Function Tests:  Recent Labs Lab 08/01/14 0202  AST 24  ALT 35  ALKPHOS 84  BILITOT 0.5  PROT 6.8  ALBUMIN 3.8   No results for input(s): LIPASE, AMYLASE in the last 168 hours. No results for input(s): AMMONIA in the last 168 hours. CBC:  Recent Labs Lab 07/31/14 1812 07/31/14 1822 08/01/14 0202  WBC 7.8  --  6.6  NEUTROABS 4.9  --  3.4  HGB 14.4 15.0 15.0  HCT 43.4 44.0 44.6  MCV 89.1  --  89.0  PLT 139*  --  126*   Cardiac Enzymes:  Recent Labs Lab 07/31/14 2034 08/01/14 0202 08/01/14 0757  TROPONINI <0.03 <0.03 <0.03   BNP: BNP (last 3 results)  Recent Labs  07/31/14 1800  BNP 10.0    ProBNP (last  3 results) No results for input(s): PROBNP in the last 8760 hours.  CBG: No results for input(s): GLUCAP in the last 168 hours.     SignedGwenyth Bender  [facility] 08/01/2014, 2:36 PM

## 2014-08-02 LAB — HEMOGLOBIN A1C
HEMOGLOBIN A1C: 5.8 % — AB (ref 4.8–5.6)
MEAN PLASMA GLUCOSE: 120 mg/dL

## 2014-08-10 ENCOUNTER — Encounter: Payer: Self-pay | Admitting: Neurology

## 2014-08-10 ENCOUNTER — Ambulatory Visit (INDEPENDENT_AMBULATORY_CARE_PROVIDER_SITE_OTHER): Payer: Managed Care, Other (non HMO) | Admitting: Neurology

## 2014-08-10 VITALS — BP 140/94 | HR 76 | Resp 20 | Ht 70.0 in | Wt 221.7 lb

## 2014-08-10 DIAGNOSIS — G4482 Headache associated with sexual activity: Secondary | ICD-10-CM | POA: Insufficient documentation

## 2014-08-10 MED ORDER — PROPRANOLOL HCL 40 MG PO TABS: 40.0000 mg | ORAL_TABLET | Freq: Two times a day (BID) | ORAL | Status: DC

## 2014-08-10 NOTE — Patient Instructions (Signed)
To try an prevent the headaches, take propranolol 40mg  twice daily.  Call in 4 weeks with update. Follow up in 3 months.
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
## 2014-08-23 ENCOUNTER — Ambulatory Visit: Payer: Managed Care, Other (non HMO) | Admitting: Neurology

## 2014-09-05 ENCOUNTER — Encounter (HOSPITAL_COMMUNITY): Payer: Self-pay | Admitting: *Deleted

## 2014-09-05 ENCOUNTER — Emergency Department (HOSPITAL_COMMUNITY)
Admission: EM | Admit: 2014-09-05 | Discharge: 2014-09-06 | Disposition: A | Discharge status: Home / Self Care | Payer: Managed Care, Other (non HMO) | Attending: Emergency Medicine | Admitting: Emergency Medicine

## 2014-09-05 DIAGNOSIS — R0981 Nasal congestion: Secondary | ICD-10-CM | POA: Diagnosis not present

## 2014-09-05 DIAGNOSIS — Z87891 Personal history of nicotine dependence: Secondary | ICD-10-CM | POA: Diagnosis not present

## 2014-09-05 DIAGNOSIS — Z8669 Personal history of other diseases of the nervous system and sense organs: Secondary | ICD-10-CM | POA: Insufficient documentation

## 2014-09-05 DIAGNOSIS — Z8719 Personal history of other diseases of the digestive system: Secondary | ICD-10-CM | POA: Diagnosis not present

## 2014-09-05 DIAGNOSIS — I10 Essential (primary) hypertension: Secondary | ICD-10-CM | POA: Insufficient documentation

## 2014-09-05 DIAGNOSIS — H109 Unspecified conjunctivitis: Secondary | ICD-10-CM | POA: Diagnosis not present

## 2014-09-05 DIAGNOSIS — H578 Other specified disorders of eye and adnexa: Secondary | ICD-10-CM | POA: Diagnosis present

## 2014-09-05 DIAGNOSIS — Z79899 Other long term (current) drug therapy: Secondary | ICD-10-CM | POA: Diagnosis not present

## 2014-09-05 NOTE — ED Notes (Signed)
Pt reporting redness, swelling and discharge from right eye.  States symptoms began last night, and got worse today.

## 2014-09-06 MED ORDER — TOBRAMYCIN 0.3 % OP SOLN
2.0000 [drp] | Freq: Once | OPHTHALMIC | Status: AC
  Administered 2014-09-06: 2 [drp] via OPHTHALMIC
  Filled 2014-09-06: qty 5

## 2014-09-06 NOTE — ED Provider Notes (Signed)
CSN: [id number]     Arrival date & time 09/05/14  2051 History   First MD Initiated Contact with Patient 09/05/14 2231     Chief Complaint  Patient presents with  . Eye Pain     (Consider location/radiation/quality/duration/timing/severity/associated sxs/prior Treatment) Patient is a 39 y.o. male presenting with conjunctivitis. The history is provided by the patient.  Conjunctivitis This is a new problem. The current episode started yesterday. The problem occurs constantly. The problem has been gradually worsening. Associated symptoms include chills and congestion. Pertinent negatives include no fever, nausea, rash or sore throat. Exacerbated by: bright lights. He has tried nothing for the symptoms. The treatment provided no relief.    Past Medical History  Diagnosis Date  . Allergy   . Hypertension     HCTZ 2005-2008.  [name]. OSA (obstructive sleep apnea) 05/05/2006    Mild.  No CPAP warranted.  . Periapical granuloma     left mandible lateral per CT 07/2014   Past Surgical History  Procedure Laterality Date  . Nasal sinus surgery     Family History  Problem Relation Age of Onset  . Diabetes Father    History  Substance Use Topics  . Smoking status: Former Smoker    Quit date: 08/01/2007  . Smokeless tobacco: Never Used  . Alcohol Use: 0.0 oz/week    0 Standard drinks or equivalent per week     Comment: are    Review of Systems  Constitutional: Positive for chills. Negative for fever.  HENT: Positive for congestion. Negative for sore throat.   Eyes: Positive for photophobia, discharge, redness and itching.  Gastrointestinal: Negative for nausea.  Skin: Negative for rash.  All other systems reviewed and are negative.     Allergies  Morphine and Guaifenesin  Home Medications   Prior to Admission medications   Medication Sig Start Date End Date Taking? Authorizing Provider  Homeopathic Products [facility](SIMILASAN ALLERGY EYE RELIEF OP) Apply 2 drops to eye daily as needed  (eye irritation).   Yes Historical Provider, MD  ibuprofen (ADVIL,MOTRIN) 200 MG tablet Take 200 mg by mouth every 6 (six) hours as needed for headache, mild pain or moderate pain.   Yes Historical Provider, MD  Multiple Vitamins-Minerals (MENS ONE DAILY PO) Take 1 tablet by mouth daily.   Yes Historical Provider, MD  propranolol (INDERAL) 40 MG tablet Take 1 tablet (40 mg total) by mouth 2 (two) times daily. 08/10/14  Yes Adam Mliss Fritz Jaffe, DO  azithromycin (ZITHROMAX Z-PAK) 250 MG tablet 500 mg on day one then 250mg  daily until complete. Patient not taking: Reported on 08/10/2014 08/01/14   Gwenyth BenderKaren M Black, NP  oxyCODONE-acetaminophen (PERCOCET/ROXICET) 5-325 MG per tablet Take 2 tablets by mouth every 4 (four) hours as needed for pain. Patient not taking: Reported on 07/31/2014 01/02/13   Kristen N Ward, DO  pantoprazole (PROTONIX) 40 MG tablet Take 1 tablet (40 mg total) by mouth daily. Patient not taking: Reported on 08/10/2014 08/01/14   Erick BlinksJehanzeb Memon, MD   BP 144/80 mmHg  Pulse 61  Temp(Src) 98.2 F (36.8 C) (Oral)  Resp 16  Ht 5\' 10"  (1.778 m)  Wt 210 lb (95.255 kg)  BMI 30.13 kg/m2  SpO2 98% Physical Exam  Constitutional: He is oriented to person, place, and time. He appears well-developed and well-nourished.  Non-toxic appearance.  HENT:  Head: Normocephalic.  Right Ear: Tympanic membrane and external ear normal.  Left Ear: Tympanic membrane and external ear normal.  Eyes: EOM are normal. Pupils  are equal, round, and reactive to light. Right eye exhibits discharge and exudate. Right eye exhibits no chemosis and no hordeolum. No foreign body present in the right eye. Left eye exhibits discharge and exudate. Left eye exhibits no chemosis and no hordeolum. No foreign body present in the left eye. Right conjunctiva is injected. Left conjunctiva is injected. No scleral icterus.  Neck: Normal range of motion. Neck supple. Carotid bruit is not present.  Cardiovascular: Normal rate, regular rhythm,  normal heart sounds, intact distal pulses and normal pulses.   Pulmonary/Chest: Breath sounds normal. No respiratory distress.  Abdominal: Soft. Bowel sounds are normal. There is no tenderness. There is no guarding.  Musculoskeletal: Normal range of motion.  Lymphadenopathy:       Head (right side): No submandibular adenopathy present.       Head (left side): No submandibular adenopathy present.    He has no cervical adenopathy.  Neurological: He is alert and oriented to person, place, and time. He has normal strength. No cranial nerve deficit or sensory deficit.  Skin: Skin is warm and dry.  Psychiatric: He has a normal mood and affect. His speech is normal.  Nursing note and vitals reviewed.   ED Course  Procedures (including critical care time) Labs Review Labs Reviewed - No data to display  Imaging Review No results found.   EKG Interpretation None      MDM  Vital signs within normal limits. Pulse oximetry is 98% on room air.  Examination favors conjunctivitis, involving both eyes.  Patient will be treated with tobramycin ophthalmic drops, and cool compresses. We have discussed the contagious nature of this illness area and the family acknowledges understanding of the same.    Final diagnoses:  Bilateral conjunctivitis    *I have reviewed nursing notes, vital signs, and all appropriate lab and imaging results for this patient.[address]   Johnaton Sonneborn, PA-C 09/06/14 1655  Blane OharaJoshua Zavitz, MD 09/09/14 [phone]

## 2014-09-06 NOTE — Discharge Instructions (Signed)
Your examination favors conjunctivitis (pink eye). Please use 2 tobramycin eyedrops in each eye every 4 hours for the next 5 days. Please wash hands frequently. Please white count surfaces. This is very contagious, please keep her distance from others. Please use cool compresses 3 or 4 times daily. Use of sunglasses and a hat with brim may be helpful to prevent bright lights from causing more pain. Please see your primary physician for additional evaluation and management if not improving. Conjunctivitis Conjunctivitis is commonly called "pink eye." Conjunctivitis can be caused by bacterial or viral infection, allergies, or injuries. There is usually redness of the lining of the eye, itching, discomfort, and sometimes discharge. There may be deposits of matter along the eyelids. A viral infection usually causes a watery discharge, while a bacterial infection causes a yellowish, thick discharge. Pink eye is very contagious and spreads by direct contact. You may be given antibiotic eyedrops as part of your treatment. Before using your eye medicine, remove all drainage from the eye by washing gently with warm water and cotton balls. Continue to use the medication until you have awakened 2 mornings in a row without discharge from the eye. Do not rub your eye. This increases the irritation and helps spread infection. Use separate towels from other household members. Wash your hands with soap and water before and after touching your eyes. Use cold compresses to reduce pain and sunglasses to relieve irritation from light. Do not wear contact lenses or wear eye makeup until the infection is gone. SEEK MEDICAL CARE IF:   Your symptoms are not better after 3 days of treatment.  You have increased pain or trouble seeing.  The outer eyelids become very red or swollen. Document Released: 05/29/2004 Document Revised: 07/14/2011 Document Reviewed: 04/21/2005 [facility] Patient Information 2015 [employer] [place]. This  information is not intended to replace advice given to you by your health care provider. Make sure you discuss any questions you have with your health care provider.

## 2014-10-03 ENCOUNTER — Telehealth: Payer: Self-pay | Admitting: Neurology

## 2014-10-03 NOTE — Telephone Encounter (Signed)
Advised Marc Johnston even though his medication is working he needs to keep his appt

## 2014-10-03 NOTE — Telephone Encounter (Signed)
Pt called and the medication he is taking works as long as he takes it and was wondering if he needed to keep his appointment on 11/21/2014, [phone]/Dawn

## 2014-10-04 ENCOUNTER — Other Ambulatory Visit: Payer: Self-pay | Admitting: Neurology

## 2014-10-18 ENCOUNTER — Other Ambulatory Visit: Payer: Self-pay | Admitting: Neurology

## 2014-10-21 ENCOUNTER — Other Ambulatory Visit: Payer: Self-pay | Admitting: Neurology

## 2014-11-21 ENCOUNTER — Ambulatory Visit: Payer: Managed Care, Other (non HMO) | Admitting: Neurology

## 2014-11-24 ENCOUNTER — Encounter: Payer: Self-pay | Admitting: Neurology

## 2014-11-24 ENCOUNTER — Ambulatory Visit (INDEPENDENT_AMBULATORY_CARE_PROVIDER_SITE_OTHER): Payer: Managed Care, Other (non HMO) | Admitting: Neurology

## 2014-11-24 VITALS — BP 140/84 | HR 78 | Resp 18 | Ht 70.0 in | Wt 219.0 lb

## 2014-11-24 DIAGNOSIS — G4482 Headache associated with sexual activity: Secondary | ICD-10-CM

## 2014-11-24 MED ORDER — PROPRANOLOL HCL 40 MG PO TABS: 40.0000 mg | ORAL_TABLET | Freq: Two times a day (BID) | ORAL | Status: DC

## 2014-11-24 NOTE — Progress Notes (Signed)
NEUROLOGY FOLLOW UP OFFICE NOTE  CLAY SOLUM [id number]  HISTORY OF PRESENT ILLNESS: Marc Johnston is a 39 year old right-handed man with hypertension, OSA and history of sinus surgery who follows up for primary headache associated with sexual activity and memory problems.  UPDATE: He is taking propranolol  twice daily.  Sometimes he forgets and skips a dose.  It is effective.  He has not had headaches.  HISTORY: In March 2016, he began having headaches associated with intercourse.  It would occur either during or afterwards.  It felt like his prior migraines.  They are bi-frontal, severe, 5-7/10.  There is no nausea, photophobia, phonophobia or visual disturbance.  It lasts no more than 10-15 minutes after he stops and rests.  It doesn't occur every time he has intercourse.  He was admitted to [facility] on 07/31/14 for one of these headaches and chest pain.  CTA of the head and neck was unremarkable.  CT incidentally showed large periapical granuloma around the left mandible lateral incisor.  MRI of the brain was normal.  Cardiac workup, including troponins and EKG, were negative.  He was given a Z-pak for chronic sinusitis.    He does have history of migraines which resolved several years ago following sinus surgery.    PAST MEDICAL HISTORY: Past Medical History  Diagnosis Date  . Allergy   . Hypertension     HCTZ 2005-2008.  [name] OSA (obstructive sleep apnea) 05/05/2006    Mild.  No CPAP warranted.  . Periapical granuloma     left mandible lateral per CT 07/2014    MEDICATIONS: Current Outpatient Prescriptions on File Prior to Visit  Medication Sig Dispense Refill  . Homeopathic Products [facility] ALLERGY EYE RELIEF OP) Apply 2 drops to eye daily as needed (eye irritation).    [name] ibuprofen (ADVIL,MOTRIN) 200 MG tablet Take 200 mg by mouth every 6 (six) hours as needed for headache, mild pain or moderate pain.    . Multiple Vitamins-Minerals (MENS ONE DAILY PO) Take 1  tablet by mouth daily.    [name] azithromycin (ZITHROMAX Z-PAK) 250 MG tablet 500 mg on day one then  daily until complete. (Patient not taking: Reported on 08/10/2014) 6 each 0  . oxyCODONE-acetaminophen (PERCOCET/ROXICET) 5-325 MG per tablet Take 2 tablets by mouth every 4 (four) hours as needed for pain. (Patient not taking: Reported on 07/31/2014) 15 tablet 0  . pantoprazole (PROTONIX) 40 MG tablet Take 1 tablet (40 mg total) by mouth daily. (Patient not taking: Reported on 08/10/2014) 30 tablet 1   No current facility-administered medications on file prior to visit.    ALLERGIES: Allergies  Allergen Reactions  . Morphine Other (See Comments)    Severe agitation  . Guaifenesin Other (See Comments)    unknown    FAMILY HISTORY: Family History  Problem Relation Age of Onset  . Diabetes Father     SOCIAL HISTORY: History   Social History  . Marital Status: Divorced    Spouse Name: N/A  . Number of Children: N/A  . Years of Education: N/A   Occupational History  . Not on file.   Social History Main Topics  . Smoking status: Former Smoker    Quit date: 08/01/2007  . Smokeless tobacco: Never Used  . Alcohol Use: 0.0 oz/week    0 Standard drinks or equivalent per week     Comment: are  . Drug Use: No  . Sexual Activity:    Partners: Female  Other Topics Concern  . Not on file   Social History Narrative   Marital status: married      Children:  Two children (3, 2)      Lives: with wife, two children, stepson.      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        Tobacco:  Quit smoking 2008.      Alcohol: none      Drugs: none      Exercise: none          REVIEW OF SYSTEMS: Constitutional: No fevers, chills, or sweats, no generalized fatigue, change in appetite Eyes: No visual changes, double vision, eye pain Ear, nose and throat: No hearing loss, ear pain, nasal congestion, sore throat Cardiovascular: No chest pain, palpitations Respiratory:  No shortness  of breath at rest or with exertion, wheezes GastrointestinaI: No nausea, vomiting, diarrhea, abdominal pain, fecal incontinence Genitourinary:  No dysuria, urinary retention or frequency Musculoskeletal:  No neck pain, back pain Integumentary: No rash, pruritus, skin lesions Neurological: as above Psychiatric: No depression, insomnia, anxiety Endocrine: No palpitations, fatigue, diaphoresis, mood swings, change in appetite, change in weight, increased thirst Hematologic/Lymphatic:  No anemia, purpura, petechiae. Allergic/Immunologic: no itchy/runny eyes, nasal congestion, recent allergic reactions, rashes  PHYSICAL EXAM: Filed Vitals:   11/24/14 1529  BP: 140/84  Pulse: 78  Resp: 18   General: No acute distress.  Patient appears well-groomed.   Head:  Normocephalic/atraumatic Eyes:  Fundoscopic exam unremarkable without vessel changes, exudates, hemorrhages or papilledema. Neck: supple, no paraspinal tenderness, full range of motion Heart:  Regular rate and rhythm Lungs:  Clear to auscultation bilaterally Back: No paraspinal tenderness Neurological Exam: alert and oriented to person, place, and time. Attention span and concentration intact, recent and remote memory intact, fund of knowledge intact.  Speech fluent and not dysarthric, language intact.  CN II-XII intact. Fundoscopic exam unremarkable without vessel changes, exudates, hemorrhages or papilledema.  Bulk and tone normal, muscle strength 5/5 throughout.  Sensation to light touch, temperature and vibration intact.  Deep tendon reflexes 2+ throughout, toes downgoing.  Finger to nose and heel to shin testing intact.  Gait normal, Romberg negative.  IMPRESSION: Primary headache associated with sexual activity  PLAN: Continue propranolol 40mg  twice daily Follow up in one year  15 minutes spent face to face with patient, over 50% spent discussing management.  Shon Millet, DO  CC:  Elfredia Nevins, MD

## 2014-11-24 NOTE — Patient Instructions (Addendum)
Continue propranolol  twice daily Follow up in one year

## 2014-12-11 ENCOUNTER — Telehealth: Payer: Self-pay | Admitting: Neurology

## 2014-12-11 NOTE — Telephone Encounter (Signed)
Pt called and needs a prescription for Propranolor called in/Dawn CB [phone]

## 2014-12-11 NOTE — Telephone Encounter (Signed)
Pharmacy already has 90 day supply RX  For medication already

## 2014-12-18 ENCOUNTER — Ambulatory Visit (INDEPENDENT_AMBULATORY_CARE_PROVIDER_SITE_OTHER): Payer: Managed Care, Other (non HMO) | Admitting: Physician Assistant

## 2014-12-18 VITALS — BP 138/80 | HR 62 | Temp 98.3°F | Resp 16 | Ht 70.0 in | Wt 217.2 lb

## 2014-12-18 DIAGNOSIS — R0981 Nasal congestion: Secondary | ICD-10-CM

## 2014-12-18 DIAGNOSIS — J0100 Acute maxillary sinusitis, unspecified: Secondary | ICD-10-CM

## 2014-12-18 MED ORDER — IPRATROPIUM BROMIDE 0.03 % NA SOLN: 2.0000 | Freq: Two times a day (BID) | NASAL | Status: DC

## 2014-12-18 MED ORDER — AMOXICILLIN-POT CLAVULANATE 875-125 MG PO TABS: 1.0000 | ORAL_TABLET | Freq: Two times a day (BID) | ORAL | Status: DC

## 2014-12-18 NOTE — Patient Instructions (Signed)
You likely have a sinus infection at this time. Please take the augmentin twice daily for 10 days.  Please take sudafed every 6-8 hours for congestion.  Using the atrovent nasal spray twice daily in each nostril will help.  Please come back to see [place] if you're not improved in 4-5 days.   Sinusitis Sinusitis is redness, soreness, and inflammation of the paranasal sinuses. Paranasal sinuses are air pockets within the bones of your face (beneath the eyes, the middle of the forehead, or above the eyes). In healthy paranasal sinuses, mucus is able to drain out, and air is able to circulate through them by way of your nose. However, when your paranasal sinuses are inflamed, mucus and air can become trapped. This can allow bacteria and other germs to grow and cause infection. Sinusitis can develop quickly and last only a short time (acute) or continue over a long period (chronic). Sinusitis that lasts for more than 12 weeks is considered chronic.  CAUSES  Causes of sinusitis include:  Allergies.  Structural abnormalities, such as displacement of the cartilage that separates your nostrils (deviated septum), which can decrease the air flow through your nose and sinuses and affect sinus drainage.  Functional abnormalities, such as when the small hairs (cilia) that line your sinuses and help remove mucus do not work properly or are not present. SIGNS AND SYMPTOMS  Symptoms of acute and chronic sinusitis are the same. The primary symptoms are pain and pressure around the affected sinuses. Other symptoms include:  Upper toothache.  Earache.  Headache.  Bad breath.  Decreased sense of smell and taste.  A cough, which worsens when you are lying flat.  Fatigue.  Fever.  Thick drainage from your nose, which often is green and may contain pus (purulent).  Swelling and warmth over the affected sinuses. DIAGNOSIS  Your health care provider will perform a physical exam. During the exam, your  health care provider may:  Look in your nose for signs of abnormal growths in your nostrils (nasal polyps).  Tap over the affected sinus to check for signs of infection.  View the inside of your sinuses (endoscopy) using an imaging device that has a light attached (endoscope). If your health care provider suspects that you have chronic sinusitis, one or more of the following tests may be recommended:  Allergy tests.  Nasal culture. A sample of mucus is taken from your nose, sent to a lab, and screened for bacteria.  Nasal cytology. A sample of mucus is taken from your nose and examined by your health care provider to determine if your sinusitis is related to an allergy. TREATMENT  Most cases of acute sinusitis are related to a viral infection and will resolve on their own within 10 days. Sometimes medicines are prescribed to help relieve symptoms (pain medicine, decongestants, nasal steroid sprays, or saline sprays).  However, for sinusitis related to a bacterial infection, your health care provider will prescribe antibiotic medicines. These are medicines that will help kill the bacteria causing the infection.  Rarely, sinusitis is caused by a fungal infection. In theses cases, your health care provider will prescribe antifungal medicine. For some cases of chronic sinusitis, surgery is needed. Generally, these are cases in which sinusitis recurs more than 3 times per year, despite other treatments. HOME CARE INSTRUCTIONS   Drink plenty of water. Water helps thin the mucus so your sinuses can drain more easily.  Use a humidifier.  Inhale steam 3 to 4 times a day (for  example, sit in the bathroom with the shower running).  Apply a warm, moist washcloth to your face 3 to 4 times a day, or as directed by your health care provider.  Use saline nasal sprays to help moisten and clean your sinuses.  Take medicines only as directed by your health care provider.  If you were prescribed either  an antibiotic or antifungal medicine, finish it all even if you start to feel better. SEEK IMMEDIATE MEDICAL CARE IF:  You have increasing pain or severe headaches.  You have nausea, vomiting, or drowsiness.  You have swelling around your face.  You have vision problems.  You have a stiff neck.  You have difficulty breathing. MAKE SURE YOU:   Understand these instructions.  Will watch your condition.  Will get help right away if you are not doing well or get worse. Document Released: 04/21/2005 Document Revised: 09/05/2013 Document Reviewed: 05/06/2011 [facility] Patient Information 2015 [place], [place]. This information is not intended to replace advice given to you by your health care provider. Make sure you discuss any questions you have with your health care provider.

## 2014-12-18 NOTE — Progress Notes (Signed)
   Subjective:    Patient ID: NIKHOLAS GEFFRE, male    DOB: Mar 26, 1976, 39 y.o.   MRN: [id number]  Chief Complaint  Patient presents with  . Sinus Problem    x 1 week    Medications, allergies, past medical history, surgical history, family history, social history and problem list reviewed and updated.  HPI  12 yom presents with sinus issues.   Has felt congested past 7-8 days. Yest congestion got significantly worse. Facial pressure. Trouble breathing through nose. Denies fevers, chills. Denies otalgia, st, abd pain.   Has hx sinus issues. Had bilateral maxillary sinus surgery in 2000. Had head ct March 2016 which showed mucosal thickening inferior left maxillary sinus.   Review of Systems See HPI     Objective:   Physical Exam  Constitutional: He appears well-developed and well-nourished.  Non-toxic appearance. He does not have a sickly appearance. He does not appear ill. No distress.  BP 138/80 mmHg  Pulse 62  Temp(Src) 98.3 F (36.8 C) (Oral)  Resp 16  Ht  (1.778 m)  Wt 217 lb 3.2 oz (98.521 kg)  BMI 31.16 kg/m2  SpO2 98%   HENT:  Right Ear: A middle ear effusion is present.  Left Ear: A middle ear effusion is present.  Nose: Mucosal edema and rhinorrhea present. Right sinus exhibits maxillary sinus tenderness. Right sinus exhibits no frontal sinus tenderness. Left sinus exhibits maxillary sinus tenderness. Left sinus exhibits no frontal sinus tenderness.  Mouth/Throat: Uvula is midline, oropharynx is clear and moist and mucous membranes are normal.  Severe left maxillary sinus ttp.   Pulmonary/Chest: Effort normal and breath sounds normal. No tachypnea.      Assessment & Plan:   Acute maxillary sinusitis, recurrence not specified - Plan: amoxicillin-clavulanate (AUGMENTIN) 875-125 MG per tablet  Nasal congestion - Plan: ipratropium (ATROVENT) 0.03 % nasal spray --will tx for bacterial sinusitis with recurrent sinus issues, anatomic issues, worsening of sx  yest, and severe ttp over sinus --sudafed, atrovent --rtc if not improved 4-5 days  Donnajean Lopes, PA-C Physician Assistant-Certified [facility]  12/18/2014 5:15 PM

## 2015-01-22 ENCOUNTER — Other Ambulatory Visit: Payer: Self-pay | Admitting: Physician Assistant

## 2015-05-29 ENCOUNTER — Ambulatory Visit: Payer: Managed Care, Other (non HMO) | Admitting: Urology

## 2015-06-13 ENCOUNTER — Encounter: Payer: Self-pay | Admitting: Neurology

## 2015-10-29 ENCOUNTER — Emergency Department (HOSPITAL_COMMUNITY): Payer: Self-pay

## 2015-10-29 ENCOUNTER — Encounter (HOSPITAL_COMMUNITY): Payer: Self-pay | Admitting: Emergency Medicine

## 2015-10-29 ENCOUNTER — Other Ambulatory Visit: Payer: Self-pay

## 2015-10-29 ENCOUNTER — Emergency Department (HOSPITAL_COMMUNITY)
Admission: EM | Admit: 2015-10-29 | Discharge: 2015-10-29 | Disposition: A | Discharge status: Home / Self Care | Payer: Self-pay | Attending: Emergency Medicine | Admitting: Emergency Medicine

## 2015-10-29 DIAGNOSIS — I10 Essential (primary) hypertension: Secondary | ICD-10-CM | POA: Insufficient documentation

## 2015-10-29 DIAGNOSIS — Z79899 Other long term (current) drug therapy: Secondary | ICD-10-CM | POA: Insufficient documentation

## 2015-10-29 DIAGNOSIS — R0789 Other chest pain: Secondary | ICD-10-CM | POA: Insufficient documentation

## 2015-10-29 DIAGNOSIS — R079 Chest pain, unspecified: Secondary | ICD-10-CM

## 2015-10-29 DIAGNOSIS — Z87891 Personal history of nicotine dependence: Secondary | ICD-10-CM | POA: Insufficient documentation

## 2015-10-29 HISTORY — DX: Major depressive disorder, single episode, unspecified: F32.9

## 2015-10-29 HISTORY — DX: Depression, unspecified: F32.A

## 2015-10-29 LAB — BASIC METABOLIC PANEL
ANION GAP: 8 (ref 5–15)
BUN: 6 mg/dL (ref 6–20)
CALCIUM: 9.8 mg/dL (ref 8.9–10.3)
CO2: 23 mmol/L (ref 22–32)
Chloride: 107 mmol/L (ref 101–111)
Creatinine, Ser: 0.9 mg/dL (ref 0.61–1.24)
GFR calc non Af Amer: >60 mL/min (ref >60)
Glucose, Bld: 94 mg/dL (ref 65–99)
Potassium: 4.4 mmol/L (ref 3.5–5.1)
Sodium: 138 mmol/L (ref 135–145)

## 2015-10-29 LAB — CBC
HCT: 45.8 % (ref 39.0–52.0)
HEMOGLOBIN: 15.4 g/dL (ref 13.0–17.0)
MCH: 29.4 pg (ref 26.0–34.0)
MCHC: 33.6 g/dL (ref 30.0–36.0)
MCV: 87.6 fL (ref 78.0–100.0)
Platelets: 150 10*3/uL (ref 150–400)
RBC: 5.23 MIL/uL (ref 4.22–5.81)
RDW: 12.8 % (ref 11.5–15.5)
WBC: 7.5 10*3/uL (ref 4.0–10.5)

## 2015-10-29 LAB — I-STAT TROPONIN, ED
TROPONIN I, POC: 0 ng/mL (ref 0.00–0.08)
Troponin i, poc: 0 ng/mL (ref 0.00–0.08)

## 2015-10-29 LAB — D-DIMER, QUANTITATIVE (NOT AT ARMC)

## 2015-10-29 NOTE — ED Notes (Signed)
Cp and sob started this am  No n/v  Has been dizzy

## 2015-10-29 NOTE — ED Notes (Signed)
MD at bedside. 

## 2015-10-29 NOTE — ED Provider Notes (Signed)
CSN: [id number]     Arrival date & time 10/29/15  1018 History   First MD Initiated Contact with Patient 10/29/15 1035     Chief Complaint  Patient presents with  . Chest Pain    HPI Comments: 40 y.o. male with a past medical history of hypertension, OSA, depression presents to the ED with chest pain. Described as tightness, worse with inspiration, associated with diaphoresis. No nausea, vomiting. The pain does not radiate. No syncope. The pain resolved spontaneously.  Patient is a 40 y.o. male presenting with chest pain. The history is provided by the patient.  Chest Pain Pain location:  Substernal area Pain quality: tightness   Pain radiates to:  Does not radiate Pain radiates to the back: no   Pain severity:  Severe Onset quality:  Sudden Duration: Since 8:30am. Timing:  Constant Progression:  Resolved Chronicity:  New Context: at rest and stress   Context comment:  Recent death of family members 3 months ago, and his baby 3 days ago Relieved by:  None tried Worsened by:  Nothing tried Ineffective treatments:  None tried Associated symptoms: diaphoresis   Associated symptoms: no abdominal pain, no cough, no fever, no lower extremity edema, no nausea and not vomiting   Risk factors: hypertension, male sex and obesity   Risk factors: no coronary artery disease, no prior DVT/PE and no smoking     Past Medical History  Diagnosis Date  . Allergy   . Hypertension     HCTZ 2005-2008.  [name] OSA (obstructive sleep apnea) 05/05/2006    Mild.  No CPAP warranted.  . Periapical granuloma     left mandible lateral per CT 07/2014  . Depression    Past Surgical History  Procedure Laterality Date  . Nasal sinus surgery     Family History  Problem Relation Age of Onset  . Diabetes Father   . Heart disease Father   . Heart disease Mother    Social History  Substance Use Topics  . Smoking status: Former Smoker    Quit date: 08/01/2007  . Smokeless tobacco: Never Used  . Alcohol  Use: 0.0 oz/week    0 Standard drinks or equivalent per week     Comment: are    Review of Systems  Constitutional: Positive for diaphoresis. Negative for fever.  HENT: Negative for congestion.   Eyes: Negative for redness.  Respiratory: Negative for cough.   Cardiovascular: Positive for chest pain.  Gastrointestinal: Negative for nausea, vomiting and abdominal pain.  Genitourinary: Negative for flank pain.  Musculoskeletal: Negative for gait problem.  Skin: Negative for rash.  Neurological: Negative for speech difficulty.  Psychiatric/Behavioral: Negative for confusion.      Allergies  Morphine and Guaifenesin  Home Medications   Prior to Admission medications   Medication Sig Start Date End Date Taking? Authorizing Provider  amoxicillin-clavulanate (AUGMENTIN) 875-125 MG per tablet Take 1 tablet by mouth 2 (two) times daily. 12/18/14   Todd McVeigh, PA  ipratropium (ATROVENT) 0.03 % nasal spray PLACE 2 SPRAYS INTO BOTH NOSTRILS 2 (TWO) TIMES DAILY. 01/23/15   Chelle Jeffery, PA-C  Multiple Vitamins-Minerals (MENS ONE DAILY PO) Take 1 tablet by mouth daily.    Historical Provider, MD  oxyCODONE-acetaminophen (PERCOCET/ROXICET) 5-325 MG per tablet Take 2 tablets by mouth every 4 (four) hours as needed for pain. 01/02/13   Kristen N Ward, DO  pantoprazole (PROTONIX) 40 MG tablet Take 1 tablet (40 mg total) by mouth daily. 08/01/14   Erick Blinks, MD  propranolol (INDERAL) 40 MG tablet Take 1 tablet (40 mg total) by mouth 2 (two) times daily. 11/24/14   Drema DallasAdam R Jaffe, DO   BP 132/92 mmHg  Pulse 58  Temp(Src) 97.8 F (36.6 C) (Oral)  Resp 16  SpO2 100% Physical Exam  Constitutional: He is oriented to person, place, and time. He appears well-developed and well-nourished. No distress.  HENT:  Head: Normocephalic and atraumatic.  Right Ear: External ear normal.  Left Ear: External ear normal.  Neck: Normal range of motion.  Cardiovascular: Normal rate, regular rhythm and intact  distal pulses.   Pulses:      Radial pulses are 2+ on the right side, and 2+ on the left side.       Posterior tibial pulses are 2+ on the right side, and 2+ on the left side.  No peripheral edema or calf tenderness  Pulmonary/Chest: Effort normal.  Abdominal: Soft. He exhibits no distension. There is no tenderness.  Neurological: He is alert and oriented to person, place, and time.  Face symmetric, speech clear and appropriate, moves all extremities spontaneously  Skin: Skin is warm and dry. He is not diaphoretic.  Psychiatric: He expresses no homicidal and no suicidal ideation.  Tearful when discussing recent deaths of family members, but denies SI/HI, makes good eye contact  Vitals reviewed.   ED Course  Procedures  Labs Review Labs Reviewed  BASIC METABOLIC PANEL  CBC  D-DIMER, QUANTITATIVE (NOT AT [facility])  I-STAT TROPOININ, ED  Rosezena SensorI-STAT TROPOININ, ED    Imaging Review Dg Chest 2 View  10/29/2015  CLINICAL DATA:  Chest tightness this morning EXAM: CHEST  2 VIEW COMPARISON:  07/31/2014 FINDINGS: Normal heart size and mediastinal contours. No acute infiltrate or edema. No effusion or pneumothorax. No acute osseous findings. IMPRESSION: Negative chest. Electronically Signed   By: Marnee SpringJonathon  Watts M.D.   On: 10/29/2015 11:14   I have personally reviewed and evaluated these images and lab results as part of my medical decision-making.   EKG Interpretation   Date/Time:  Monday October 29 2015 10:22:53 EDT Ventricular Rate:  57 PR Interval:  158 QRS Duration: 82 QT Interval:  396 QTC Calculation: 385 R Axis:   47 Text Interpretation:  Sinus bradycardia Nonspecific T wave abnormality  Abnormal ECG Confirmed by Ranae PalmsYELVERTON  MD, DAVID ([id number]) on 10/29/2015  3:27:24 PM      MDM   Final diagnoses:  Chest pain, unspecified chest pain type   40 y.o. male with a past medical history of hypertension, OSA, depression presents to the ED with chest pain. Described as tightness, worse with  inspiration, associated with diaphoresis. No nausea, vomiting. The pain does not radiate. No syncope. The pain resolved spontaneously.  Hear score is 3. He is PERC negative, however given the pleuritic nature of this pain, will check a d-dimer.  CBC, BMP, initial troponin are all unremarkable. D-dimer is not elevated. Repeat troponin is 0. Chest x-ray shows no acute problems. His EKG shows no acute ischemia.  On reassessment the patient is smiling, calm, talking on cell phone. He reports he is feeling much better and feels comfortable going home. No further chest pain.  Strict return precautions were discussed and given writing. Follow up with PCP. Discharged home.  I do not suspect aortic dissection, pneumothorax, pneumonia. His d-dimer was negative, I do not suspect PE. He had 2 troponins that were 0, with an unremarkable EKG.  Case managed in conjunction with my attending, Dr. Ranae PalmsYelverton.  Maxine GlennAnn Puneet Selden, MD  10/29/15 1634  Loren Raceravid Yelverton, MD 11/02/15 2212

## 2015-10-29 NOTE — ED Notes (Signed)
Patient transported to X-ray 

## 2015-10-29 NOTE — ED Notes (Signed)
Pt back from XR. Pt hooked back up to monitors.

## 2015-10-29 NOTE — Discharge Instructions (Signed)
Nonspecific Chest Pain  °Chest pain can be caused by many different conditions. There is always a chance that your pain could be related to something serious, such as a heart attack or a blood clot in your lungs. Chest pain can also be caused by conditions that are not life-threatening. If you have chest pain, it is very important to follow up with your health care provider. °CAUSES  °Chest pain can be caused by: °· Heartburn. °· Pneumonia or bronchitis. °· Anxiety or stress. °· Inflammation around your heart (pericarditis) or lung (pleuritis or pleurisy). °· A blood clot in your lung. °· A collapsed lung (pneumothorax). It can develop suddenly on its own (spontaneous pneumothorax) or from trauma to the chest. °· Shingles infection (varicella-zoster virus). °· Heart attack. °· Damage to the bones, muscles, and cartilage that make up your chest wall. This can include: °¨ Bruised bones due to injury. °¨ Strained muscles or cartilage due to frequent or repeated coughing or overwork. °¨ Fracture to one or more ribs. °¨ Sore cartilage due to inflammation (costochondritis). °RISK FACTORS  °Risk factors for chest pain may include: °· Activities that increase your risk for trauma or injury to your chest. °· Respiratory infections or conditions that cause frequent coughing. °· Medical conditions or overeating that can cause heartburn. °· Heart disease or family history of heart disease. °· Conditions or health behaviors that increase your risk of developing a blood clot. °· Having had chicken pox (varicella zoster). °SIGNS AND SYMPTOMS °Chest pain can feel like: °· Burning or tingling on the surface of your chest or deep in your chest. °· Crushing, pressure, aching, or squeezing pain. °· Dull or sharp pain that is worse when you move, cough, or take a deep breath. °· Pain that is also felt in your back, neck, shoulder, or arm, or pain that spreads to any of these areas. °Your chest pain may come and go, or it may stay  constant. °DIAGNOSIS °Lab tests or other studies may be needed to find the cause of your pain. Your health care provider may have you take a test called an ambulatory ECG (electrocardiogram). An ECG records your heartbeat patterns at the time the test is performed. You may also have other tests, such as: °· Transthoracic echocardiogram (TTE). During echocardiography, sound waves are used to create a picture of all of the heart structures and to look at how blood flows through your heart. °· Transesophageal echocardiogram (TEE). This is a more advanced imaging test that obtains images from inside your body. It allows your health care provider to see your heart in finer detail. °· Cardiac monitoring. This allows your health care provider to monitor your heart rate and rhythm in real time. °· Holter monitor. This is a portable device that records your heartbeat and can help to diagnose abnormal heartbeats. It allows your health care provider to track your heart activity for several days, if needed. °· Stress tests. These can be done through exercise or by taking medicine that makes your heart beat more quickly. °· Blood tests. °· Imaging tests. °TREATMENT  °Your treatment depends on what is causing your chest pain. Treatment may include: °· Medicines. These may include: °¨ Acid blockers for heartburn. °¨ Anti-inflammatory medicine. °¨ Pain medicine for inflammatory conditions. °¨ Antibiotic medicine, if an infection is present. °¨ Medicines to dissolve blood clots. °¨ Medicines to treat coronary artery disease. °· Supportive care for conditions that do not require medicines. This may include: °¨ Resting. °¨ Applying heat   or cold packs to injured areas. °¨ Limiting activities until pain decreases. °HOME CARE INSTRUCTIONS °· If you were prescribed an antibiotic medicine, finish it all even if you start to feel better. °· Avoid any activities that bring on chest pain. °· Do not use any tobacco products, including  cigarettes, chewing tobacco, or electronic cigarettes. If you need help quitting, ask your health care provider. °· Do not drink alcohol. °· Take medicines only as directed by your health care provider. °· Keep all follow-up visits as directed by your health care provider. This is important. This includes any further testing if your chest pain does not go away. °· If heartburn is the cause for your chest pain, you may be told to keep your head raised (elevated) while sleeping. This reduces the chance that acid will go from your stomach into your esophagus. °· Make lifestyle changes as directed by your health care provider. These may include: °¨ Getting regular exercise. Ask your health care provider to suggest some activities that are safe for you. °¨ Eating a heart-healthy diet. A registered dietitian can help you to learn healthy eating options. °¨ Maintaining a healthy weight. °¨ Managing diabetes, if necessary. °¨ Reducing stress. °SEEK MEDICAL CARE IF: °· Your chest pain does not go away after treatment. °· You have a rash with blisters on your chest. °· You have a fever. °SEEK IMMEDIATE MEDICAL CARE IF:  °· Your chest pain is worse. °· You have an increasing cough, or you cough up blood. °· You have severe abdominal pain. °· You have severe weakness. °· You faint. °· You have chills. °· You have sudden, unexplained chest discomfort. °· You have sudden, unexplained discomfort in your arms, back, neck, or jaw. °· You have shortness of breath at any time. °· You suddenly start to sweat, or your skin gets clammy. °· You feel nauseous or you vomit. °· You suddenly feel light-headed or dizzy. °· Your heart begins to beat quickly, or it feels like it is skipping beats. °These symptoms may represent a serious problem that is an emergency. Do not wait to see if the symptoms will go away. Get medical help right away. Call your local emergency services (911 in the [place] Do not drive yourself to the hospital. °  °This  information is not intended to replace advice given to you by your health care provider. Make sure you discuss any questions you have with your health care provider. °  °Document Released: 01/29/2005 Document Revised: 05/12/2014 Document Reviewed: 11/25/2013 °Elsevier Interactive Patient Education ©2016 [employer] ° °

## 2015-10-29 NOTE — ED Notes (Signed)
Pt verbalized understanding of d/c instructions and has no further questions. Pt stable, cp free and NAD. Pt ambulatory. Pt to follow up with pcp.

## 2015-11-26 ENCOUNTER — Ambulatory Visit: Payer: Managed Care, Other (non HMO) | Admitting: Neurology

## 2016-01-16 ENCOUNTER — Other Ambulatory Visit: Payer: Self-pay | Admitting: Neurology

## 2016-01-22 ENCOUNTER — Telehealth: Payer: Self-pay | Admitting: Neurology

## 2016-01-22 NOTE — Telephone Encounter (Signed)
Marc DawnJoseph Johnston [birth year]. He uses [facility] [phone]. He said Dr. Everlena CooperJaffe was going to put him on a permanent refill of his  Blood Pressure medication. He said he is out and has no refills on it. His # is [phone]. Thank you

## 2016-01-22 NOTE — Telephone Encounter (Signed)
Called patient and he said he would contact his family Doctor. He said he had no insurance and could not pay anything toward the $185 fee. I did tell him we could bill him. He chose to contact hi PCP. Thank you

## 2017-03-27 ENCOUNTER — Other Ambulatory Visit (HOSPITAL_COMMUNITY): Payer: Self-pay | Admitting: Neurosurgery

## 2017-03-27 DIAGNOSIS — M5440 Lumbago with sciatica, unspecified side: Secondary | ICD-10-CM

## 2017-04-03 ENCOUNTER — Ambulatory Visit (HOSPITAL_COMMUNITY)
Admission: RE | Admit: 2017-04-03 | Discharge: 2017-04-03 | Disposition: A | Discharge status: Home / Self Care | Payer: Managed Care, Other (non HMO) | Source: Ambulatory Visit | Attending: Neurosurgery | Admitting: Neurosurgery

## 2017-04-03 DIAGNOSIS — M5127 Other intervertebral disc displacement, lumbosacral region: Secondary | ICD-10-CM | POA: Insufficient documentation

## 2017-04-03 DIAGNOSIS — M48061 Spinal stenosis, lumbar region without neurogenic claudication: Secondary | ICD-10-CM | POA: Insufficient documentation

## 2017-04-03 DIAGNOSIS — M5126 Other intervertebral disc displacement, lumbar region: Secondary | ICD-10-CM | POA: Diagnosis not present

## 2017-04-03 DIAGNOSIS — M8938 Hypertrophy of bone, other site: Secondary | ICD-10-CM | POA: Insufficient documentation

## 2017-04-03 DIAGNOSIS — M5440 Lumbago with sciatica, unspecified side: Secondary | ICD-10-CM | POA: Insufficient documentation

## 2017-09-02 ENCOUNTER — Emergency Department
Admission: EM | Admit: 2017-09-02 | Discharge: 2017-09-02 | Disposition: A | Discharge status: Home / Self Care | Payer: Managed Care, Other (non HMO) | Attending: Emergency Medicine | Admitting: Emergency Medicine

## 2017-09-02 ENCOUNTER — Encounter: Payer: Self-pay | Admitting: Emergency Medicine

## 2017-09-02 DIAGNOSIS — R599 Enlarged lymph nodes, unspecified: Secondary | ICD-10-CM

## 2017-09-02 DIAGNOSIS — G8929 Other chronic pain: Secondary | ICD-10-CM | POA: Insufficient documentation

## 2017-09-02 DIAGNOSIS — L723 Sebaceous cyst: Secondary | ICD-10-CM

## 2017-09-02 DIAGNOSIS — Z87891 Personal history of nicotine dependence: Secondary | ICD-10-CM | POA: Insufficient documentation

## 2017-09-02 DIAGNOSIS — M25511 Pain in right shoulder: Secondary | ICD-10-CM | POA: Insufficient documentation

## 2017-09-02 DIAGNOSIS — I10 Essential (primary) hypertension: Secondary | ICD-10-CM | POA: Insufficient documentation

## 2017-09-02 DIAGNOSIS — Z79899 Other long term (current) drug therapy: Secondary | ICD-10-CM | POA: Insufficient documentation

## 2017-09-02 MED ORDER — MELOXICAM 15 MG PO TABS: 15.0000 mg | ORAL_TABLET | Freq: Every day | ORAL | 0 refills | Status: DC
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
## 2017-09-02 NOTE — Discharge Instructions (Addendum)
Please follow-up with dermatologist for sebaceous cyst.  Please follow-up with primary care provider if lymph node under left arm does not improve or resolve within 1 week.  Call orthopedic office tomorrow morning to schedule follow-up for right shoulder.

## 2017-09-02 NOTE — ED Triage Notes (Signed)
Patient presents to the ED with a swollen area under his left arm. Patient denies any pain.  Noticed area 2 days ago.  Patient is in no obvious distress at this time.

## 2017-10-04 DIAGNOSIS — Z87891 Personal history of nicotine dependence: Secondary | ICD-10-CM | POA: Diagnosis not present

## 2017-10-04 DIAGNOSIS — S43101A Unspecified dislocation of right acromioclavicular joint, initial encounter: Secondary | ICD-10-CM | POA: Diagnosis not present

## 2017-10-04 DIAGNOSIS — M25511 Pain in right shoulder: Secondary | ICD-10-CM | POA: Diagnosis not present

## 2017-10-04 DIAGNOSIS — X501XXA Overexertion from prolonged static or awkward postures, initial encounter: Secondary | ICD-10-CM | POA: Diagnosis not present

## 2017-10-04 DIAGNOSIS — Y93E2 Activity, laundry: Secondary | ICD-10-CM | POA: Diagnosis not present

## 2017-10-04 DIAGNOSIS — M5136 Other intervertebral disc degeneration, lumbar region: Secondary | ICD-10-CM | POA: Diagnosis not present

## 2017-10-04 DIAGNOSIS — M47896 Other spondylosis, lumbar region: Secondary | ICD-10-CM | POA: Diagnosis not present

## 2017-10-04 DIAGNOSIS — Z885 Allergy status to narcotic agent status: Secondary | ICD-10-CM | POA: Diagnosis not present

## 2017-10-04 DIAGNOSIS — S39012A Strain of muscle, fascia and tendon of lower back, initial encounter: Secondary | ICD-10-CM | POA: Diagnosis not present

## 2017-10-04 DIAGNOSIS — G8911 Acute pain due to trauma: Secondary | ICD-10-CM | POA: Diagnosis not present

## 2017-10-04 DIAGNOSIS — S4351XA Sprain of right acromioclavicular joint, initial encounter: Secondary | ICD-10-CM | POA: Diagnosis not present

## 2017-10-04 DIAGNOSIS — M545 Low back pain: Secondary | ICD-10-CM | POA: Diagnosis not present

## 2017-10-04 DIAGNOSIS — M549 Dorsalgia, unspecified: Secondary | ICD-10-CM | POA: Diagnosis not present

## 2017-10-04 DIAGNOSIS — Z888 Allergy status to other drugs, medicaments and biological substances status: Secondary | ICD-10-CM | POA: Diagnosis not present

## 2017-10-05 DIAGNOSIS — M19011 Primary osteoarthritis, right shoulder: Secondary | ICD-10-CM | POA: Diagnosis not present

## 2017-10-05 DIAGNOSIS — M7551 Bursitis of right shoulder: Secondary | ICD-10-CM | POA: Diagnosis not present

## 2017-10-23 DIAGNOSIS — R52 Pain, unspecified: Secondary | ICD-10-CM | POA: Diagnosis not present

## 2017-10-23 DIAGNOSIS — M5412 Radiculopathy, cervical region: Secondary | ICD-10-CM | POA: Diagnosis not present

## 2017-10-23 DIAGNOSIS — M7551 Bursitis of right shoulder: Secondary | ICD-10-CM | POA: Diagnosis not present

## 2017-10-23 DIAGNOSIS — M19011 Primary osteoarthritis, right shoulder: Secondary | ICD-10-CM | POA: Diagnosis not present

## 2017-11-18 DIAGNOSIS — M7551 Bursitis of right shoulder: Secondary | ICD-10-CM | POA: Diagnosis not present

## 2017-11-18 DIAGNOSIS — M5412 Radiculopathy, cervical region: Secondary | ICD-10-CM | POA: Diagnosis not present

## 2017-11-18 DIAGNOSIS — M19011 Primary osteoarthritis, right shoulder: Secondary | ICD-10-CM | POA: Diagnosis not present

## 2018-05-17 DIAGNOSIS — M7551 Bursitis of right shoulder: Secondary | ICD-10-CM | POA: Diagnosis not present

## 2018-05-17 DIAGNOSIS — M5412 Radiculopathy, cervical region: Secondary | ICD-10-CM | POA: Diagnosis not present

## 2018-05-17 DIAGNOSIS — M19011 Primary osteoarthritis, right shoulder: Secondary | ICD-10-CM | POA: Diagnosis not present

## 2018-06-14 DIAGNOSIS — R936 Abnormal findings on diagnostic imaging of limbs: Secondary | ICD-10-CM | POA: Diagnosis not present

## 2018-06-14 DIAGNOSIS — M50123 Cervical disc disorder at C6-C7 level with radiculopathy: Secondary | ICD-10-CM | POA: Diagnosis not present

## 2018-06-14 DIAGNOSIS — M67813 Other specified disorders of tendon, right shoulder: Secondary | ICD-10-CM | POA: Diagnosis not present

## 2018-06-14 DIAGNOSIS — M7581 Other shoulder lesions, right shoulder: Secondary | ICD-10-CM | POA: Diagnosis not present

## 2018-06-14 DIAGNOSIS — M50223 Other cervical disc displacement at C6-C7 level: Secondary | ICD-10-CM | POA: Diagnosis not present

## 2018-06-14 DIAGNOSIS — M7551 Bursitis of right shoulder: Secondary | ICD-10-CM | POA: Diagnosis not present

## 2018-06-14 DIAGNOSIS — M19011 Primary osteoarthritis, right shoulder: Secondary | ICD-10-CM | POA: Diagnosis not present

## 2018-06-21 DIAGNOSIS — M5412 Radiculopathy, cervical region: Secondary | ICD-10-CM | POA: Diagnosis not present

## 2018-06-21 DIAGNOSIS — M9981 Other biomechanical lesions of cervical region: Secondary | ICD-10-CM | POA: Diagnosis not present

## 2018-06-21 DIAGNOSIS — M5030 Other cervical disc degeneration, unspecified cervical region: Secondary | ICD-10-CM | POA: Diagnosis not present

## 2018-06-21 DIAGNOSIS — M7551 Bursitis of right shoulder: Secondary | ICD-10-CM | POA: Diagnosis not present

## 2018-07-16 DIAGNOSIS — M5030 Other cervical disc degeneration, unspecified cervical region: Secondary | ICD-10-CM | POA: Diagnosis not present

## 2018-07-16 DIAGNOSIS — M9981 Other biomechanical lesions of cervical region: Secondary | ICD-10-CM | POA: Diagnosis not present

## 2018-07-16 DIAGNOSIS — M47812 Spondylosis without myelopathy or radiculopathy, cervical region: Secondary | ICD-10-CM | POA: Diagnosis not present

## 2018-08-18 DIAGNOSIS — M5412 Radiculopathy, cervical region: Secondary | ICD-10-CM | POA: Diagnosis not present

## 2018-08-18 DIAGNOSIS — M9981 Other biomechanical lesions of cervical region: Secondary | ICD-10-CM | POA: Diagnosis not present

## 2018-08-18 DIAGNOSIS — M47812 Spondylosis without myelopathy or radiculopathy, cervical region: Secondary | ICD-10-CM | POA: Diagnosis not present

## 2018-08-18 DIAGNOSIS — M5030 Other cervical disc degeneration, unspecified cervical region: Secondary | ICD-10-CM | POA: Diagnosis not present

## 2018-09-28 DIAGNOSIS — M5020 Other cervical disc displacement, unspecified cervical region: Secondary | ICD-10-CM | POA: Diagnosis not present

## 2018-09-28 DIAGNOSIS — M5412 Radiculopathy, cervical region: Secondary | ICD-10-CM | POA: Diagnosis not present

## 2018-11-18 DIAGNOSIS — L02224 Furuncle of groin: Secondary | ICD-10-CM | POA: Diagnosis not present

## 2019-01-05 DIAGNOSIS — M5020 Other cervical disc displacement, unspecified cervical region: Secondary | ICD-10-CM | POA: Diagnosis not present

## 2019-01-05 DIAGNOSIS — M5412 Radiculopathy, cervical region: Secondary | ICD-10-CM | POA: Diagnosis not present

## 2019-01-13 DIAGNOSIS — M5020 Other cervical disc displacement, unspecified cervical region: Secondary | ICD-10-CM | POA: Diagnosis not present

## 2019-01-13 DIAGNOSIS — M5412 Radiculopathy, cervical region: Secondary | ICD-10-CM | POA: Diagnosis not present

## 2019-01-19 DIAGNOSIS — Z6834 Body mass index (BMI) 34.0-34.9, adult: Secondary | ICD-10-CM | POA: Diagnosis not present

## 2019-01-19 DIAGNOSIS — M5030 Other cervical disc degeneration, unspecified cervical region: Secondary | ICD-10-CM | POA: Diagnosis not present

## 2019-01-19 DIAGNOSIS — E6609 Other obesity due to excess calories: Secondary | ICD-10-CM | POA: Diagnosis not present

## 2019-01-19 DIAGNOSIS — M5412 Radiculopathy, cervical region: Secondary | ICD-10-CM | POA: Diagnosis not present

## 2019-02-15 DIAGNOSIS — Z1389 Encounter for screening for other disorder: Secondary | ICD-10-CM | POA: Diagnosis not present

## 2019-02-15 DIAGNOSIS — Z0001 Encounter for general adult medical examination with abnormal findings: Secondary | ICD-10-CM | POA: Diagnosis not present

## 2019-02-15 DIAGNOSIS — Z6834 Body mass index (BMI) 34.0-34.9, adult: Secondary | ICD-10-CM | POA: Diagnosis not present

## 2019-02-15 DIAGNOSIS — G894 Chronic pain syndrome: Secondary | ICD-10-CM | POA: Diagnosis not present

## 2019-02-15 DIAGNOSIS — Z23 Encounter for immunization: Secondary | ICD-10-CM | POA: Diagnosis not present

## 2019-02-15 DIAGNOSIS — R7309 Other abnormal glucose: Secondary | ICD-10-CM | POA: Diagnosis not present

## 2019-02-15 DIAGNOSIS — I10 Essential (primary) hypertension: Secondary | ICD-10-CM | POA: Diagnosis not present

## 2019-02-22 DIAGNOSIS — M5412 Radiculopathy, cervical region: Secondary | ICD-10-CM | POA: Diagnosis not present

## 2019-02-24 DIAGNOSIS — M6281 Muscle weakness (generalized): Secondary | ICD-10-CM | POA: Diagnosis not present

## 2019-02-24 DIAGNOSIS — M2560 Stiffness of unspecified joint, not elsewhere classified: Secondary | ICD-10-CM | POA: Diagnosis not present

## 2019-02-24 DIAGNOSIS — M542 Cervicalgia: Secondary | ICD-10-CM | POA: Diagnosis not present

## 2019-03-01 DIAGNOSIS — M6281 Muscle weakness (generalized): Secondary | ICD-10-CM | POA: Diagnosis not present

## 2019-03-01 DIAGNOSIS — M542 Cervicalgia: Secondary | ICD-10-CM | POA: Diagnosis not present

## 2019-03-01 DIAGNOSIS — M2560 Stiffness of unspecified joint, not elsewhere classified: Secondary | ICD-10-CM | POA: Diagnosis not present

## 2019-03-03 DIAGNOSIS — M2560 Stiffness of unspecified joint, not elsewhere classified: Secondary | ICD-10-CM | POA: Diagnosis not present

## 2019-03-03 DIAGNOSIS — M6281 Muscle weakness (generalized): Secondary | ICD-10-CM | POA: Diagnosis not present

## 2019-03-03 DIAGNOSIS — M542 Cervicalgia: Secondary | ICD-10-CM | POA: Diagnosis not present

## 2019-03-08 DIAGNOSIS — M542 Cervicalgia: Secondary | ICD-10-CM | POA: Diagnosis not present

## 2019-03-08 DIAGNOSIS — M6281 Muscle weakness (generalized): Secondary | ICD-10-CM | POA: Diagnosis not present

## 2019-03-08 DIAGNOSIS — M2560 Stiffness of unspecified joint, not elsewhere classified: Secondary | ICD-10-CM | POA: Diagnosis not present

## 2019-03-10 DIAGNOSIS — M542 Cervicalgia: Secondary | ICD-10-CM | POA: Diagnosis not present

## 2019-03-10 DIAGNOSIS — M2560 Stiffness of unspecified joint, not elsewhere classified: Secondary | ICD-10-CM | POA: Diagnosis not present

## 2019-03-10 DIAGNOSIS — M6281 Muscle weakness (generalized): Secondary | ICD-10-CM | POA: Diagnosis not present

## 2019-03-14 DIAGNOSIS — M2560 Stiffness of unspecified joint, not elsewhere classified: Secondary | ICD-10-CM | POA: Diagnosis not present

## 2019-03-14 DIAGNOSIS — M542 Cervicalgia: Secondary | ICD-10-CM | POA: Diagnosis not present

## 2019-03-14 DIAGNOSIS — M6281 Muscle weakness (generalized): Secondary | ICD-10-CM | POA: Diagnosis not present

## 2019-03-15 ENCOUNTER — Other Ambulatory Visit: Payer: Self-pay | Admitting: Neurosurgery

## 2019-03-15 DIAGNOSIS — M4802 Spinal stenosis, cervical region: Secondary | ICD-10-CM | POA: Diagnosis not present

## 2019-03-16 ENCOUNTER — Other Ambulatory Visit: Payer: Self-pay

## 2019-03-16 ENCOUNTER — Other Ambulatory Visit
Admission: RE | Admit: 2019-03-16 | Discharge: 2019-03-16 | Disposition: A | Discharge status: Home / Self Care | Payer: BLUE CROSS/BLUE SHIELD | Source: Ambulatory Visit | Attending: Neurosurgery | Admitting: Neurosurgery

## 2019-03-16 DIAGNOSIS — Z01818 Encounter for other preprocedural examination: Secondary | ICD-10-CM | POA: Insufficient documentation

## 2019-03-16 DIAGNOSIS — Z20828 Contact with and (suspected) exposure to other viral communicable diseases: Secondary | ICD-10-CM | POA: Diagnosis not present

## 2019-03-16 DIAGNOSIS — R9431 Abnormal electrocardiogram [ECG] [EKG]: Secondary | ICD-10-CM | POA: Insufficient documentation

## 2019-03-16 DIAGNOSIS — I10 Essential (primary) hypertension: Secondary | ICD-10-CM | POA: Diagnosis not present

## 2019-03-16 HISTORY — DX: Other complications of anesthesia, initial encounter: T88.59XA

## 2019-03-16 LAB — BASIC METABOLIC PANEL
Anion gap: 7 (ref 5–15)
BUN: 12 mg/dL (ref 6–20)
CO2: 26 mmol/L (ref 22–32)
Calcium: 9 mg/dL (ref 8.9–10.3)
Chloride: 106 mmol/L (ref 98–111)
Creatinine, Ser: 0.6 mg/dL — ABNORMAL LOW (ref 0.61–1.24)
GFR calc Af Amer: >60 mL/min (ref >60)
GFR calc non Af Amer: >60 mL/min (ref >60)
Glucose, Bld: 97 mg/dL (ref 70–99)
Potassium: 3.8 mmol/L (ref 3.5–5.1)
Sodium: 139 mmol/L (ref 135–145)

## 2019-03-16 LAB — TYPE AND SCREEN
ABO/RH(D): O NEG
Antibody Screen: NEGATIVE

## 2019-03-16 LAB — URINALYSIS, ROUTINE W REFLEX MICROSCOPIC
Bilirubin Urine: NEGATIVE
Glucose, UA: NEGATIVE mg/dL
Hgb urine dipstick: NEGATIVE
Ketones, ur: NEGATIVE mg/dL
Leukocytes,Ua: NEGATIVE
Nitrite: NEGATIVE
Protein, ur: NEGATIVE mg/dL
Specific Gravity, Urine: 1.016 (ref 1.005–1.030)
pH: 7 (ref 5.0–8.0)

## 2019-03-16 LAB — PROTIME-INR
INR: 1.1 (ref 0.8–1.2)
Prothrombin Time: 14.1 seconds (ref 11.4–15.2)

## 2019-03-16 LAB — CBC
HCT: 45.7 % (ref 39.0–52.0)
Hemoglobin: 15.1 g/dL (ref 13.0–17.0)
MCH: 30.5 pg (ref 26.0–34.0)
MCHC: 33 g/dL (ref 30.0–36.0)
MCV: 92.3 fL (ref 80.0–100.0)
Platelets: 126 10*3/uL — ABNORMAL LOW (ref 150–400)
RBC: 4.95 MIL/uL (ref 4.22–5.81)
RDW: 12.7 % (ref 11.5–15.5)
WBC: 7.3 10*3/uL (ref 4.0–10.5)
nRBC: 0 % (ref 0.0–0.2)

## 2019-03-16 LAB — SURGICAL PCR SCREEN
MRSA, PCR: POSITIVE — AB
Staphylococcus aureus: POSITIVE — AB

## 2019-03-16 LAB — APTT: aPTT: 31 seconds (ref 24–36)

## 2019-03-16 LAB — SARS CORONAVIRUS 2 (TAT 6-24 HRS): SARS Coronavirus 2: NEGATIVE

## 2019-03-16 NOTE — Patient Instructions (Signed)
Your procedure is scheduled on: Monday March 21, 2019 Report to Day Surgery on the 2nd floor of the [facility]. To find out your arrival time, please call [phone] between 1PM - 3PM on: Friday March 18, 2019  REMEMBER: Instructions that are not followed completely may result in serious medical risk, up to and including death; or upon the discretion of your surgeon and anesthesiologist your surgery may need to be rescheduled.  Do not eat food after midnight the night before surgery.  No gum chewing, lozengers or hard candies.  You may however, drink CLEAR liquids up to 2 hours before you are scheduled to arrive for your surgery. Do not drink anything within 2 hours of the start of your surgery.  Clear liquids include: - water  - apple juice without pulp - CLEAR gatorade - black coffee or tea (Do NOT add milk or creamers to the coffee or tea) Do NOT drink anything that is not on this list.  Type 1 and Type 2 diabetics should only drink water.  No Alcohol for 24 hours before or after surgery.  No Smoking including e-cigarettes for 24 hours prior to surgery.  No chewable tobacco products for at least 6 hours prior to surgery.  No nicotine patches on the day of surgery.  On the morning of surgery brush your teeth with toothpaste and water, you may rinse your mouth with mouthwash if you wish. Do not swallow any toothpaste or mouthwash.  Notify your doctor if there is any change in your medical condition (cold, fever, infection).  Do not wear jewelry, make-up, hairpins, clips or nail polish.  Do not wear lotions, powders, or perfumes, DEODORANT  Do not shave 48 hours prior to surgery.   Contacts and dentures may not be worn into surgery.  Do not bring valuables to the hospital, including drivers license, insurance or credit cards.  [facility] is not responsible for any belongings or valuables.   TAKE THESE MEDICATIONS THE MORNING OF  SURGERY: GABAPENTIN TRAMADOL ATORVASTATIN    Use CHG Soap as directed on instruction sheet.  Follow recommendations from Cardiologist, Pulmonologist or PCP regarding stopping Aspirin, Coumadin, Plavix, Eliquis, Pradaxa, or Pletal.  Stop Anti-inflammatories (NSAIDS) such as Advil, Aleve, Ibuprofen, Motrin, Naproxen, Naprosyn and Aspirin based products such as Excedrin, Goodys Powder, BC Powder. (May take Tylenol or Acetaminophen if needed.)  Stop ANY OVER THE COUNTER supplements until after surgery. (May continue Vitamin D, Vitamin B, and multivitamin.)  Wear comfortable clothing (specific to your surgery type) to the hospital.  Plan for stool softeners for home use.  If you are being admitted to the hospital overnight, BRING SMALL BAG WITH YOU. After surgery it may be brought to your room.  If you are being discharged the day of surgery, you will not be allowed to drive home. You will need a responsible adult to drive you home and stay with you that night.   If you are taking public transportation, you will need to have a responsible adult with you. Please confirm with your physician that it is acceptable to use public transportation.   Please call [phone] if you have any questions about these instructions.

## 2019-03-17 DIAGNOSIS — M2560 Stiffness of unspecified joint, not elsewhere classified: Secondary | ICD-10-CM | POA: Diagnosis not present

## 2019-03-17 DIAGNOSIS — M542 Cervicalgia: Secondary | ICD-10-CM | POA: Diagnosis not present

## 2019-03-17 DIAGNOSIS — M6281 Muscle weakness (generalized): Secondary | ICD-10-CM | POA: Diagnosis not present

## 2019-03-17 NOTE — Pre-Procedure Instructions (Addendum)
Pre-Admit Testing Provider Notification Note  Provider Notified: Dr. Lacinda Axon  Notification Mode: Fax  Reason: Positive MRSA pcr.  Response: Fax confirmation received. Acknowledged and new order received to switch antibiotic to Vancomycin.  Additional Information: Ancef discontinued. Vancomycin ordered. Placed on chart.   Signed: Beulah Gandy, RN

## 2019-03-21 ENCOUNTER — Encounter
Admission: RE | Disposition: A | Discharge status: Home / Self Care | Payer: Self-pay | Source: Home / Self Care | Attending: Neurosurgery

## 2019-03-21 ENCOUNTER — Other Ambulatory Visit: Payer: Self-pay

## 2019-03-21 ENCOUNTER — Observation Stay
Admission: RE | Admit: 2019-03-21 | Discharge: 2019-03-22 | Disposition: A | Discharge status: Home / Self Care | Payer: BLUE CROSS/BLUE SHIELD | Attending: Neurosurgery | Admitting: Neurosurgery

## 2019-03-21 ENCOUNTER — Ambulatory Visit: Payer: BLUE CROSS/BLUE SHIELD | Admitting: Certified Registered"

## 2019-03-21 ENCOUNTER — Ambulatory Visit: Payer: BLUE CROSS/BLUE SHIELD

## 2019-03-21 ENCOUNTER — Encounter: Payer: Self-pay | Admitting: *Deleted

## 2019-03-21 DIAGNOSIS — F17200 Nicotine dependence, unspecified, uncomplicated: Secondary | ICD-10-CM | POA: Insufficient documentation

## 2019-03-21 DIAGNOSIS — Z981 Arthrodesis status: Secondary | ICD-10-CM

## 2019-03-21 DIAGNOSIS — Z885 Allergy status to narcotic agent status: Secondary | ICD-10-CM | POA: Diagnosis not present

## 2019-03-21 DIAGNOSIS — M5010 Cervical disc disorder with radiculopathy, unspecified cervical region: Secondary | ICD-10-CM | POA: Diagnosis not present

## 2019-03-21 DIAGNOSIS — G4733 Obstructive sleep apnea (adult) (pediatric): Secondary | ICD-10-CM | POA: Insufficient documentation

## 2019-03-21 DIAGNOSIS — I10 Essential (primary) hypertension: Secondary | ICD-10-CM | POA: Diagnosis not present

## 2019-03-21 DIAGNOSIS — M5412 Radiculopathy, cervical region: Secondary | ICD-10-CM | POA: Diagnosis not present

## 2019-03-21 DIAGNOSIS — Z79891 Long term (current) use of opiate analgesic: Secondary | ICD-10-CM | POA: Diagnosis not present

## 2019-03-21 DIAGNOSIS — M50123 Cervical disc disorder at C6-C7 level with radiculopathy: Principal | ICD-10-CM | POA: Insufficient documentation

## 2019-03-21 DIAGNOSIS — Z79899 Other long term (current) drug therapy: Secondary | ICD-10-CM | POA: Insufficient documentation

## 2019-03-21 DIAGNOSIS — Z888 Allergy status to other drugs, medicaments and biological substances status: Secondary | ICD-10-CM | POA: Insufficient documentation

## 2019-03-21 DIAGNOSIS — M4802 Spinal stenosis, cervical region: Secondary | ICD-10-CM | POA: Insufficient documentation

## 2019-03-21 DIAGNOSIS — Z419 Encounter for procedure for purposes other than remedying health state, unspecified: Secondary | ICD-10-CM

## 2019-03-21 DIAGNOSIS — M5020 Other cervical disc displacement, unspecified cervical region: Secondary | ICD-10-CM | POA: Diagnosis not present

## 2019-03-21 HISTORY — PX: CERVICAL DISC ARTHROPLASTY: SHX587

## 2019-03-21 LAB — ABO/RH: ABO/RH(D): O NEG

## 2019-03-21 SURGERY — CERVICAL ANTERIOR DISC ARTHROPLASTY
Anesthesia: General

## 2019-03-21 MED ORDER — ACETAMINOPHEN 10 MG/ML IV SOLN
INTRAVENOUS | Status: AC
  Filled 2019-03-21: qty 100

## 2019-03-21 MED ORDER — REMIFENTANIL HCL 1 MG IV SOLR
INTRAVENOUS | Status: DC | PRN
  Administered 2019-03-21: .15 ug/kg/min via INTRAVENOUS

## 2019-03-21 MED ORDER — MIDAZOLAM HCL 2 MG/2ML IJ SOLN
INTRAMUSCULAR | Status: DC | PRN
  Administered 2019-03-21: 2 mg via INTRAVENOUS

## 2019-03-21 MED ORDER — OXYCODONE HCL 5 MG PO TABS: 5.0000 mg | ORAL_TABLET | ORAL | Status: DC | PRN

## 2019-03-21 MED ORDER — BISACODYL 5 MG PO TBEC: 5.0000 mg | DELAYED_RELEASE_TABLET | Freq: Every day | ORAL | Status: DC | PRN

## 2019-03-21 MED ORDER — FENTANYL CITRATE (PF) 100 MCG/2ML IJ SOLN
INTRAMUSCULAR | Status: AC
  Filled 2019-03-21: qty 2

## 2019-03-21 MED ORDER — KETOROLAC TROMETHAMINE 30 MG/ML IJ SOLN
INTRAMUSCULAR | Status: AC
  Filled 2019-03-21: qty 1

## 2019-03-21 MED ORDER — PANTOPRAZOLE SODIUM 40 MG PO TBEC
40.0000 mg | DELAYED_RELEASE_TABLET | Freq: Every day | ORAL | Status: DC
  Administered 2019-03-21 – 2019-03-22 (×2): 40 mg via ORAL
  Filled 2019-03-21 (×2): qty 1

## 2019-03-21 MED ORDER — LIDOCAINE HCL (CARDIAC) PF 100 MG/5ML IV SOSY
PREFILLED_SYRINGE | INTRAVENOUS | Status: DC | PRN
  Administered 2019-03-21: 100 mg via INTRAVENOUS

## 2019-03-21 MED ORDER — METHOCARBAMOL 1000 MG/10ML IJ SOLN
500.0000 mg | Freq: Four times a day (QID) | INTRAVENOUS | Status: DC | PRN
  Filled 2019-03-21: qty 5

## 2019-03-21 MED ORDER — ACETAMINOPHEN 10 MG/ML IV SOLN
INTRAVENOUS | Status: DC | PRN
  Administered 2019-03-21: 1000 mg via INTRAVENOUS

## 2019-03-21 MED ORDER — METHOCARBAMOL 500 MG PO TABS: 750.0000 mg | ORAL_TABLET | Freq: Four times a day (QID) | ORAL | Status: DC | PRN

## 2019-03-21 MED ORDER — ACETAMINOPHEN 650 MG RE SUPP: 650.0000 mg | RECTAL | Status: DC | PRN

## 2019-03-21 MED ORDER — SODIUM CHLORIDE 0.9% FLUSH: 3.0000 mL | INTRAVENOUS | Status: DC | PRN

## 2019-03-21 MED ORDER — THROMBIN 5000 UNITS EX SOLR
CUTANEOUS | Status: AC
  Filled 2019-03-21: qty 5000

## 2019-03-21 MED ORDER — PROMETHAZINE HCL 25 MG/ML IJ SOLN: 6.2500 mg | INTRAMUSCULAR | Status: DC | PRN

## 2019-03-21 MED ORDER — VANCOMYCIN HCL 10 G IV SOLR
1750.0000 mg | INTRAVENOUS | Status: AC
  Administered 2019-03-21: 1750 mg via INTRAVENOUS
  Filled 2019-03-21: qty 1750

## 2019-03-21 MED ORDER — LIDOCAINE-EPINEPHRINE 1 %-1:100000 IJ SOLN
INTRAMUSCULAR | Status: DC | PRN
  Administered 2019-03-21: 5 mL

## 2019-03-21 MED ORDER — REMIFENTANIL HCL 1 MG IV SOLR
INTRAVENOUS | Status: AC
  Filled 2019-03-21: qty 1000

## 2019-03-21 MED ORDER — ONDANSETRON HCL 4 MG PO TABS: 4.0000 mg | ORAL_TABLET | Freq: Four times a day (QID) | ORAL | Status: DC | PRN

## 2019-03-21 MED ORDER — FENTANYL CITRATE (PF) 100 MCG/2ML IJ SOLN
INTRAMUSCULAR | Status: DC | PRN
  Administered 2019-03-21: 50 ug via INTRAVENOUS
  Administered 2019-03-21: 100 ug via INTRAVENOUS

## 2019-03-21 MED ORDER — FAMOTIDINE 20 MG PO TABS
20.0000 mg | ORAL_TABLET | Freq: Once | ORAL | Status: AC
  Administered 2019-03-21: 13:00:00 20 mg via ORAL

## 2019-03-21 MED ORDER — MENTHOL 3 MG MT LOZG
1.0000 | LOZENGE | OROMUCOSAL | Status: DC | PRN
  Filled 2019-03-21: qty 9

## 2019-03-21 MED ORDER — SUCCINYLCHOLINE CHLORIDE 20 MG/ML IJ SOLN
INTRAMUSCULAR | Status: DC | PRN
  Administered 2019-03-21: 140 mg via INTRAVENOUS

## 2019-03-21 MED ORDER — PHENOL 1.4 % MT LIQD
1.0000 | OROMUCOSAL | Status: DC | PRN
  Filled 2019-03-21: qty 177

## 2019-03-21 MED ORDER — FAMOTIDINE 20 MG PO TABS
ORAL_TABLET | ORAL | Status: AC
  Filled 2019-03-21: qty 1

## 2019-03-21 MED ORDER — ACETAMINOPHEN 325 MG PO TABS: 650.0000 mg | ORAL_TABLET | ORAL | Status: DC | PRN

## 2019-03-21 MED ORDER — ONDANSETRON HCL 4 MG/2ML IJ SOLN
INTRAMUSCULAR | Status: DC | PRN
  Administered 2019-03-21: 4 mg via INTRAVENOUS

## 2019-03-21 MED ORDER — DEXAMETHASONE SODIUM PHOSPHATE 10 MG/ML IJ SOLN
INTRAMUSCULAR | Status: DC | PRN
  Administered 2019-03-21: 10 mg via INTRAVENOUS

## 2019-03-21 MED ORDER — VANCOMYCIN HCL 10 G IV SOLR: 2000.0000 mg | Freq: Once | INTRAVENOUS | Status: DC

## 2019-03-21 MED ORDER — SODIUM CHLORIDE 0.9% FLUSH
3.0000 mL | Freq: Two times a day (BID) | INTRAVENOUS | Status: DC
  Administered 2019-03-21: 3 mL via INTRAVENOUS

## 2019-03-21 MED ORDER — DEXMEDETOMIDINE HCL IN NACL 200 MCG/50ML IV SOLN
INTRAVENOUS | Status: DC | PRN
  Administered 2019-03-21: 4 ug via INTRAVENOUS
  Administered 2019-03-21: 20 ug via INTRAVENOUS
  Administered 2019-03-21 (×2): 4 ug via INTRAVENOUS

## 2019-03-21 MED ORDER — LIDOCAINE-EPINEPHRINE 1 %-1:100000 IJ SOLN
INTRAMUSCULAR | Status: AC
  Filled 2019-03-21: qty 1

## 2019-03-21 MED ORDER — IPRATROPIUM BROMIDE 0.03 % NA SOLN
1.0000 | Freq: Two times a day (BID) | NASAL | Status: DC
  Administered 2019-03-21 – 2019-03-22 (×2): 1 via NASAL
  Filled 2019-03-21: qty 30

## 2019-03-21 MED ORDER — OXYCODONE HCL 5 MG PO TABS: 5.0000 mg | ORAL_TABLET | Freq: Once | ORAL | Status: DC | PRN

## 2019-03-21 MED ORDER — ATORVASTATIN CALCIUM 20 MG PO TABS
20.0000 mg | ORAL_TABLET | Freq: Every day | ORAL | Status: DC
  Administered 2019-03-22: 20 mg via ORAL
  Filled 2019-03-21: qty 1

## 2019-03-21 MED ORDER — POLYETHYLENE GLYCOL 3350 17 G PO PACK: 17.0000 g | PACK | Freq: Every day | ORAL | Status: DC | PRN

## 2019-03-21 MED ORDER — SUGAMMADEX SODIUM 200 MG/2ML IV SOLN
INTRAVENOUS | Status: AC
  Filled 2019-03-21: qty 2

## 2019-03-21 MED ORDER — ACETAMINOPHEN 500 MG PO TABS
1000.0000 mg | ORAL_TABLET | Freq: Four times a day (QID) | ORAL | Status: DC
  Administered 2019-03-21 – 2019-03-22 (×3): 1000 mg via ORAL
  Filled 2019-03-21 (×3): qty 2

## 2019-03-21 MED ORDER — GELATIN ABSORBABLE 12-7 MM EX MISC
CUTANEOUS | Status: AC
  Filled 2019-03-21: qty 1

## 2019-03-21 MED ORDER — THROMBIN 5000 UNITS EX SOLR
CUTANEOUS | Status: DC | PRN
  Administered 2019-03-21: 5000 [IU] via TOPICAL

## 2019-03-21 MED ORDER — PROPOFOL 10 MG/ML IV BOLUS
INTRAVENOUS | Status: DC | PRN
  Administered 2019-03-21: 200 mg via INTRAVENOUS

## 2019-03-21 MED ORDER — SENNA 8.6 MG PO TABS
1.0000 | ORAL_TABLET | Freq: Two times a day (BID) | ORAL | Status: DC
  Administered 2019-03-21 – 2019-03-22 (×2): 8.6 mg via ORAL
  Filled 2019-03-21 (×2): qty 1

## 2019-03-21 MED ORDER — SODIUM CHLORIDE 0.9 % IV SOLN
INTRAVENOUS | Status: DC | PRN
  Administered 2019-03-21: 25 ug/min via INTRAVENOUS

## 2019-03-21 MED ORDER — EPHEDRINE SULFATE 50 MG/ML IJ SOLN
INTRAMUSCULAR | Status: DC | PRN
  Administered 2019-03-21 (×5): 5 mg via INTRAVENOUS

## 2019-03-21 MED ORDER — MAGNESIUM CITRATE PO SOLN
1.0000 | Freq: Once | ORAL | Status: DC | PRN
  Filled 2019-03-21: qty 296

## 2019-03-21 MED ORDER — ROCURONIUM BROMIDE 100 MG/10ML IV SOLN
INTRAVENOUS | Status: DC | PRN
  Administered 2019-03-21: 30 mg via INTRAVENOUS
  Administered 2019-03-21: 10 mg via INTRAVENOUS

## 2019-03-21 MED ORDER — GLYCOPYRROLATE 0.2 MG/ML IJ SOLN
INTRAMUSCULAR | Status: DC | PRN
  Administered 2019-03-21: 0.2 mg via INTRAVENOUS

## 2019-03-21 MED ORDER — NICOTINE 14 MG/24HR TD PT24
14.0000 mg | MEDICATED_PATCH | Freq: Every day | TRANSDERMAL | Status: DC
  Administered 2019-03-22: 14 mg via TRANSDERMAL
  Filled 2019-03-21: qty 1

## 2019-03-21 MED ORDER — PHENYLEPHRINE HCL (PRESSORS) 10 MG/ML IV SOLN
INTRAVENOUS | Status: DC | PRN
  Administered 2019-03-21: 200 ug via INTRAVENOUS

## 2019-03-21 MED ORDER — OXYCODONE HCL 5 MG/5ML PO SOLN: 5.0000 mg | Freq: Once | ORAL | Status: DC | PRN

## 2019-03-21 MED ORDER — FENTANYL CITRATE (PF) 100 MCG/2ML IJ SOLN: 25.0000 ug | INTRAMUSCULAR | Status: DC | PRN

## 2019-03-21 MED ORDER — FAMOTIDINE 20 MG PO TABS
ORAL_TABLET | ORAL | Status: AC
  Administered 2019-03-21: 20 mg via ORAL
  Filled 2019-03-21: qty 1

## 2019-03-21 MED ORDER — OXYCODONE HCL 5 MG PO TABS
10.0000 mg | ORAL_TABLET | ORAL | Status: DC | PRN
  Administered 2019-03-21: 10 mg via ORAL
  Filled 2019-03-21: qty 2

## 2019-03-21 MED ORDER — HYDROMORPHONE HCL 1 MG/ML IJ SOLN
INTRAMUSCULAR | Status: AC
  Filled 2019-03-21: qty 1

## 2019-03-21 MED ORDER — GABAPENTIN 300 MG PO CAPS
300.0000 mg | ORAL_CAPSULE | Freq: Three times a day (TID) | ORAL | Status: DC
  Administered 2019-03-21 – 2019-03-22 (×2): 300 mg via ORAL
  Filled 2019-03-21 (×2): qty 1

## 2019-03-21 MED ORDER — SODIUM CHLORIDE 0.9 % IV SOLN: INTRAVENOUS | Status: DC

## 2019-03-21 MED ORDER — HYDROMORPHONE HCL 1 MG/ML IJ SOLN: 0.5000 mg | INTRAMUSCULAR | Status: DC | PRN

## 2019-03-21 MED ORDER — SUGAMMADEX SODIUM 500 MG/5ML IV SOLN
INTRAVENOUS | Status: DC | PRN
  Administered 2019-03-21: 231.4 mg via INTRAVENOUS

## 2019-03-21 MED ORDER — ONDANSETRON HCL 4 MG/2ML IJ SOLN: 4.0000 mg | Freq: Four times a day (QID) | INTRAMUSCULAR | Status: DC | PRN

## 2019-03-21 MED ORDER — MIDAZOLAM HCL 2 MG/2ML IJ SOLN
INTRAMUSCULAR | Status: AC
  Filled 2019-03-21: qty 2

## 2019-03-21 MED ORDER — SODIUM CHLORIDE FLUSH 0.9 % IV SOLN
INTRAVENOUS | Status: AC
  Administered 2019-03-21: 13:00:00
  Filled 2019-03-21: qty 10

## 2019-03-21 MED ORDER — LACTATED RINGERS IV SOLN
INTRAVENOUS | Status: DC
  Administered 2019-03-21: 13:00:00 via INTRAVENOUS

## 2019-03-21 MED ORDER — SODIUM CHLORIDE 0.9 % IV SOLN: 250.0000 mL | INTRAVENOUS | Status: DC

## 2019-03-21 MED ORDER — PROPRANOLOL HCL 20 MG PO TABS
40.0000 mg | ORAL_TABLET | Freq: Two times a day (BID) | ORAL | Status: DC
  Administered 2019-03-21 – 2019-03-22 (×2): 40 mg via ORAL
  Filled 2019-03-21 (×2): qty 2

## 2019-03-21 MED ORDER — MEPERIDINE HCL 50 MG/ML IJ SOLN: 6.2500 mg | INTRAMUSCULAR | Status: DC | PRN

## 2019-03-21 SURGICAL SUPPLY — 51 items
BLADE BOVIE TIP EXT 4 (BLADE) IMPLANT
BLADE CLIPPER SURG (BLADE) ×3 IMPLANT
BUR DIAMOND COARSE 4.0 RND (BURR) IMPLANT
BUR NEURO DRILL SOFT 3.0X3.8M (BURR) ×3 IMPLANT
CANISTER SUCT 1200ML W/VALVE (MISCELLANEOUS) ×6 IMPLANT
CHLORAPREP W/TINT 26 (MISCELLANEOUS) ×6 IMPLANT
COUNTER NEEDLE 20/40 LG (NEEDLE) ×3 IMPLANT
COVER LIGHT HANDLE STERIS (MISCELLANEOUS) ×6 IMPLANT
COVER WAND RF STERILE (DRAPES) ×3 IMPLANT
CRADLE LAMINECT ARM (MISCELLANEOUS) ×3 IMPLANT
DECANTER SPIKE VIAL GLASS SM (MISCELLANEOUS) ×3 IMPLANT
DERMABOND ADVANCED (GAUZE/BANDAGES/DRESSINGS) ×2
DERMABOND ADVANCED .7 DNX12 (GAUZE/BANDAGES/DRESSINGS) ×1 IMPLANT
DISC MOBI-C CERVICAL 15X19 H6 (Miscellaneous) ×3 IMPLANT
DRAPE C-ARM 42X72 X-RAY (DRAPES) ×6 IMPLANT
DRAPE SURG 17X11 SM STRL (DRAPES) ×6 IMPLANT
DRAPE THYROID T SHEET (DRAPES) ×3 IMPLANT
ELECT CAUTERY BLADE TIP 2.5 (TIP) ×3
ELECTRODE CAUTERY BLDE TIP 2.5 (TIP) ×1 IMPLANT
FEE INTRAOP MONITOR IMPULS NCS (MISCELLANEOUS) IMPLANT
GLOVE BIOGEL PI IND STRL 7.0 (GLOVE) ×1 IMPLANT
GLOVE BIOGEL PI INDICATOR 7.0 (GLOVE) ×2
GLOVE INDICATOR 8.0 STRL GRN (GLOVE) ×3 IMPLANT
GLOVE SURG SYN 7.0 (GLOVE) ×6 IMPLANT
GLOVE SURG SYN 8.0 (GLOVE) ×6 IMPLANT
GOWN STRL REUS W/ TWL LRG LVL3 (GOWN DISPOSABLE) ×2 IMPLANT
GOWN STRL REUS W/ TWL XL LVL3 (GOWN DISPOSABLE) ×1 IMPLANT
GOWN STRL REUS W/TWL LRG LVL3 (GOWN DISPOSABLE) ×4
GOWN STRL REUS W/TWL XL LVL3 (GOWN DISPOSABLE) ×2
GRADUATE 1200CC STRL 31836 (MISCELLANEOUS) ×3 IMPLANT
INTRAOP MONITOR FEE IMPULS NCS (MISCELLANEOUS)
INTRAOP MONITOR FEE IMPULSE (MISCELLANEOUS)
KIT TURNOVER KIT A (KITS) ×3 IMPLANT
MARKER SKIN DUAL TIP RULER LAB (MISCELLANEOUS) ×6 IMPLANT
NEEDLE HYPO 22GX1.5 SAFETY (NEEDLE) ×3 IMPLANT
NEEDLE SPNL 22GX3.5 QUINCKE BK (NEEDLE) ×6 IMPLANT
NS IRRIG 1000ML POUR BTL (IV SOLUTION) ×3 IMPLANT
PACK LAMINECTOMY NEURO (CUSTOM PROCEDURE TRAY) ×3 IMPLANT
PIN CASPAR 14 (PIN) ×1 IMPLANT
PIN CASPAR 14MM (PIN) ×3
SPOGE SURGIFLO 8M (HEMOSTASIS) ×2
SPONGE KITTNER 5P (MISCELLANEOUS) ×3 IMPLANT
SPONGE SURGIFLO 8M (HEMOSTASIS) ×1 IMPLANT
SUT POLYSORB 2-0 5X18 GS-10 (SUTURE) ×6 IMPLANT
SUT VICRYL 3-0 CR8 SH (SUTURE) ×3 IMPLANT
SYR 30ML LL (SYRINGE) ×3 IMPLANT
TAPE CLOTH 3X10 WHT NS LF (GAUZE/BANDAGES/DRESSINGS) ×3 IMPLANT
TOWEL OR 17X26 4PK STRL BLUE (TOWEL DISPOSABLE) ×9 IMPLANT
TRAY FOLEY MTR SLVR 16FR STAT (SET/KITS/TRAYS/PACK) IMPLANT
TUBING CONNECTING 10 (TUBING) ×2 IMPLANT
TUBING CONNECTING 10' (TUBING) ×1

## 2019-03-21 NOTE — Progress Notes (Signed)
Procedure: C6-7 ACDF Procedure date: 03/21/2019 Diagnosis: cervical radiculopathy   History: Marc Johnston is s/p C6-7 ACDF for cervical radiculopathy  POD: Tolerated procedure well. Evaluated in post op recovery still disoriented from anesthesia but able to answer questions and obey commands.   Physical Exam: Vitals:   03/21/19 1255  BP: (!) 144/100  Pulse: 88  Resp: 16  Temp: 98.8 F (37.1 C)  SpO2: 100%    Strength: moving all extremities independently Sensation: unable to assess at this time Skin: Glue intact at incision site.   Data:  Recent Labs  Lab 03/16/19 1025  NA 139  K 3.8  CL 106  CO2 26  BUN 12  CREATININE 0.60*  GLUCOSE 97  CALCIUM 9.0   No results for input(s): AST, ALT, ALKPHOS in the last 168 hours.  Invalid input(s): TBILI   Recent Labs  Lab 03/16/19 1025  WBC 7.3  HGB 15.1  HCT 45.7  PLT 126*   Recent Labs  Lab 03/16/19 1025  APTT 31  INR 1.1         Other tests/results: Cervical x-rays pending  Assessment/Plan:  Marc Johnston is POD0 s/p C6-7 ACDF for cervical radiculopathy. Will continue to monitor  - mobilize - pain control - DVT prophylaxis - Imaging  Marin Olp PA-C Department of Neurosurgery

## 2019-03-21 NOTE — Consult Note (Signed)
Pharmacy Antibiotic Note  Marc Johnston is a 43 y.o. male admitted on (Not on file) with surgical prophylaxis.  Pharmacy has been consulted for Vancomycin dosing.  Plan: Vancomycin 2g x1 dose.   No data recorded.  Recent Labs  Lab 03/16/19 1025  WBC 7.3  CREATININE 0.60*    Estimated Creatinine Clearance: 147.9 mL/min (A) (by C-G formula based on SCr of 0.6 mg/dL (L)).    Allergies  Allergen Reactions  . Morphine Other (See Comments)    Severe agitation  . Guaifenesin Other (See Comments)    unknown   Thank you for allowing pharmacy to be a part of this patient's care.  Rowland Lathe 03/21/2019 12:17 PM

## 2019-03-21 NOTE — Transfer of Care (Signed)
Immediate Anesthesia Transfer of Care Note  Patient: Marc Johnston  Procedure(s) Performed: CERVICAL ANTERIOR DISC ARTHROPLASTY C6-7 (N/A )  Patient Location: PACU  Anesthesia Type:General  Level of Consciousness: awake, alert  and oriented  Airway & Oxygen Therapy: Patient Spontanous Breathing and Patient connected to face mask oxygen  Post-op Assessment: Report given to RN and Post -op Vital signs reviewed and stable  Post vital signs: Reviewed and stable  Last Vitals:  Vitals Value Taken Time  BP 133/78 03/21/19 1751  Temp    Pulse 103 03/21/19 1754  Resp 20 03/21/19 1754  SpO2 99 % 03/21/19 1754  Vitals shown include unvalidated device data.  Last Pain:  Vitals:   03/21/19 1749  PainSc: (P) 0-No pain      Patients Stated Pain Goal: 0 (89/21/19 4174)  Complications: No apparent anesthesia complications

## 2019-03-21 NOTE — Progress Notes (Signed)
C/o chest pain X2, pt is asymptomatic, VSS. Pt expresses that he believes pain is more muscle related, more in pectoral muscle and radiates to back. MD cook notified. C x-Ray ordered. Will continue to monitor.

## 2019-03-21 NOTE — H&P (Signed)
Marc Johnston is an 43 y.o. male.   Chief Complaint: Neck and right arm pain HPI: Marc Johnston is here for evaluation of symptoms of ongoing neck and right arm pain that been going on for approximately a year and a half. He has been seen previously for this pain by other providers and underwent multiple injections in the neck and shoulder without any relief. He is also been placed on tramadol, gabapentin, NSAIDs, and muscle relaxant without any significant relief. He has never tried physical therapy, chiropractic, acupuncture. He has not tried cervical traction. He does feel like the pain is going on the right arm towards the lateral side and posteriorly. He will occasionally get some numbness in bilateral hands but he denies any constant numbness. He has started to have a little bit of pain going down the left arm. He also endorses a significant mount of pain in the back of the neck that will travel up to the head and over to the shoulders. He has had a MRI of the cervical spine. He went for therapy and traction and got no relief. We discussed a C6/7 arthroplasty and he would like to proceed   Past Medical History:  Diagnosis Date  . Allergy   . Complication of anesthesia    wake up aggressive if confused about where he is  . Depression   . Hypertension    HCTZ 2005-2008.  [name] OSA (obstructive sleep apnea) 05/05/2006   Mild.  No CPAP warranted.  . Periapical granuloma    left mandible lateral per CT 07/2014    Past Surgical History:  Procedure Laterality Date  . FINGER SURGERY Right    as a child  reattached 2nd finger   . NASAL SINUS SURGERY      Family History  Problem Relation Age of Onset  . Diabetes Father   . Heart disease Father   . Heart disease Mother    Social History:  reports that he has been smoking. He has never used smokeless tobacco. He reports current alcohol use. He reports that he does not use drugs.  Allergies:  Allergies  Allergen Reactions  . Morphine Other (See  Comments)    Severe agitation  . Guaifenesin Other (See Comments)    unknown    Medications Prior to Admission  Medication Sig Dispense Refill  . atorvastatin (LIPITOR) 20 MG tablet Take 20 mg by mouth daily.    . Cyanocobalamin (VITAMIN B-12 PO) Take 1 tablet by mouth daily.    [name] gabapentin (NEURONTIN) 300 MG capsule Take 300 mg by mouth 3 (three) times daily.    . methocarbamol (ROBAXIN) 750 MG tablet Take 750 mg by mouth at bedtime.    . Multiple Vitamin (MULTIVITAMIN WITH MINERALS) TABS tablet Take 1 tablet by mouth daily.    . traMADol-acetaminophen (ULTRACET) 37.5-325 MG tablet Take 1 tablet by mouth 3 (three) times daily.    [name] ipratropium (ATROVENT) 0.03 % nasal spray PLACE 2 SPRAYS INTO BOTH NOSTRILS 2 (TWO) TIMES DAILY. (Patient not taking: Reported on 03/15/2019) 30 mL 0  . meloxicam (MOBIC) 15 MG tablet Take 1 tablet (15 mg total) by mouth daily. (Patient not taking: Reported on 03/15/2019) 30 tablet 0  . oxyCODONE-acetaminophen (PERCOCET/ROXICET) 5-325 MG per tablet Take 2 tablets by mouth every 4 (four) hours as needed for pain. (Patient not taking: Reported on 10/29/2015) 15 tablet 0  . pantoprazole (PROTONIX) 40 MG tablet Take 1 tablet (40 mg total) by mouth daily. (Patient not taking:  Reported on 10/29/2015) 30 tablet 1  . propranolol (INDERAL) 40 MG tablet Take 1 tablet (40 mg total) by mouth 2 (two) times daily. (Patient not taking: Reported on 03/15/2019) 60 tablet 11    Results for orders placed or performed during the hospital encounter of 03/21/19 (from the past 48 hour(s))  ABO/Rh     Status: None   Collection Time: 03/21/19  1:05 PM  Result Value Ref Range   ABO/RH(D)      Jenetta Downer NEG Performed at [facility], [address] [place], [place] [address]    No results found.  ROS General ROS: Negative Psychological ROS: Negative Ophthalmic ROS: Negative ENT ROS: Negative Hematological and Lymphatic ROS: Negative  Endocrine ROS: Negative Respiratory ROS:  Negative Cardiovascular ROS: Negative Gastrointestinal ROS: Negative Genito-Urinary ROS: Negative Musculoskeletal ROS: Positive for neck pain Neurological ROS: Positive for right arm pain Dermatological ROS: Negative  Blood pressure (!) 144/100, pulse 88, temperature 98.8 F (37.1 C), resp. rate 16, height 5' 9.5" (1.765 m), weight 115.7 kg, SpO2 100 %. Physical Exam  General appearance: Alert, cooperative, in no acute distress Head: Normocephalic, atraumatic Eyes: Normal, EOM intact Oropharynx: Wearing facemask CV: Regular rate Pulm: Clear to auscultation Neck: Supple, range of motion appears full Ext: No edema in LE bilaterally, warm extremities  Neurologic exam:  Mental status: alertness: alert, affect: normal Speech: fluent and clear Motor:strength symmetric 5/5 in bilateral upper extremities in deltoid, bicep, tricep, wrist extension, interossei. He is 5 out of 5 strength in bilateral lower extremities Sensory: intact to light touch in all extremities Reflexes: 2+ and symmetric bilaterally for biceps and patella Gait: normal   MRI cervical spine: There is a mild lordotic curvature. Alignment appears well-maintained. There is a disc osteophyte complex noted at C6-7 which does appear more symmetric to the right and causes moderate lateral recess stenosis.   Assessment/PlanPlan for C6/7 arthroplasty  [name], MD 03/21/2019, 2:38 PM

## 2019-03-21 NOTE — Consult Note (Signed)
Pharmacy Antibiotic Note  Marc Johnston is a 43 y.o. male admitted on 03/16/2019 with surgical prophylaxis.  Pharmacy has been consulted for Vancomycin dosing.  Plan: Vancomycin 1750 mg x1 dose.   Temp (24hrs), Avg:98.8 F (37.1 C), Min:98.8 F (37.1 C), Max:98.8 F (37.1 C)  Recent Labs  Lab 03/16/19 1025  WBC 7.3  CREATININE 0.60*    Estimated Creatinine Clearance: 147.9 mL/min (A) (by C-G formula based on SCr of 0.6 mg/dL (L)).    Allergies  Allergen Reactions  . Morphine Other (See Comments)    Severe agitation  . Guaifenesin Other (See Comments)    unknown   Thank you for allowing pharmacy to be a part of this patient's care.  Rowland Lathe 03/21/2019 1:24 PM

## 2019-03-21 NOTE — Anesthesia Post-op Follow-up Note (Signed)
Anesthesia QCDR form completed.        

## 2019-03-21 NOTE — Interval H&P Note (Signed)
History and Physical Interval Note:  03/21/2019 2:40 PM  Marc Johnston  has presented today for surgery, with the diagnosis of cervical radiculopathy m54.12.  The various methods of treatment have been discussed with the patient and family. After consideration of risks, benefits and other options for treatment, the patient has consented to  Procedure(s): CERVICAL ANTERIOR DISC ARTHROPLASTY C6-7 (N/A) as a surgical intervention.  The patient's history has been reviewed, patient examined, no change in status, stable for surgery.  I have reviewed the patient's chart and labs.  Questions were answered to the patient's satisfaction.     [name]

## 2019-03-21 NOTE — Op Note (Signed)
Operative Note11/16/2020  PRE-OP DIAGNOSIS:  Cervical radiculopathy [M54.12] Herniated nucleus pulposus, cervical [M50.20]   POST-OP DIAGNOSIS:Post-Op Diagnosis Codes: * Cervical radiculopathy [M54.12] * Herniated nucleus pulposus, cervical [M50.20]  Procedure(s): 1.Anterior Discectomy at C6/7 2. Cervical Arthroplasty C6/7   SURGEON: Surgeon(s) and Role: * Nathaniel Man, MD - Primary  Ivar Drape, [place], Asisstant  ANESTHESIA:General   OPERATIVE FINDINGS: Stenosis atC6/7  OPERATIVE REPORT:   Indications Mr. Viner to our clinic on10/20with ongoingrightarm pain. He failed conservative management including medications, injections,and physical therapy. He had a MRI that showed disc herniation causing lateral stenosis on the right at C6/7. Xray showed retained lordosis.Given this, we recommended an anterior cervical decompression and arthroplasty to relieve the pressure on the nerves.The risks of hematoma, infection, need for future surgery, cord injury, weakness, numbness, neck pain, stroke, and death were discussed in detail. All questions were answered and the patient elected to proceed with the surgery.   Procedure After obtaining informed consent, the patient was taken to the Operating Room where general anesthesia was induced and the patient intubated. Vascular access was obtained. Decadronand antibioticswereadministered. The head was slightly extended and imaging used to identify a skin crease overlying the C6vertebral body. Appropriate padding was performed.   The patient was prepped and draped in the usual sterile fashion and a timeout was performed per protocol. Local anesthesia was instilled with epinephrine along the planned incision site. A transverse cervicalincision was performed on the left in a skin crease. The incision was carried to the level of the platysma and then cautery was used to incise the muscle. Blunt  dissection was used to expand the plane and the dissection was carried deep medial to the SCM and carotid sheath being careful to identify the trachea and esophagus medially. The prevertebral fascia was identified and this was bluntly dissected to expose the disc spaces. A needle was placed in the disc space and x-ray confirmed the C6/7 disc level.   Next, cautery was used to undermine the longus colli muscles bilaterally and identify the C6/7disc space. Caspar pins were placed at C7and C6and a retractor system placed under the muscles to complete the exposure. Next, a combination of curettes and Kerrison rongeurs were used to remove the disc material. Curettes were used to clear the endplates from disc material.  The microscope was brought into the field for the remainder of the surgery. The bitwas used to remove the osteophyte/disc complex deep to the level of the PLL at C6/7. There was disc protrusion at this level that was removed with rongeurs.There was a large disc fragment on the right. All remaining disc material was removed to decompress centrally and then out into bilateral neuro foramen. A blunt probe was used to confirm no residual stenosis laterally and a curette used to ensure no posterior osteophyte remained. Floseal was used for hemostasis.   The disc space was sized and found to incorporate a 6 x 15 x 19 mm artificial disc. Fluoroscopy was used to place the artifical disc in the midline and seated within the disc space. Xray confirmed good placement.  The caspar pins were removed and bone wax placed.  The retractors were removed. The wound was irrigated copiously and hemostasis obtained. The platysma was closed with 2-0 vicryl suture. The dermis was closed with 3-0 Vicryl and Dermabond was placed on the skin.  The patient had general anesthesia reversed and was extubated following the procedure. She awoke following commands with symmetric movement. He was taken to the  PACU where he continued his recovery and then the ward.   ESTIMATED BLOOD LOSS:25cc  Implant DISC MOBI-C CERVICAL 15X19 H6 - [id number]  Inventory Item: [place] MOBI-C CERVICAL 15X19 H6 Serial no.:  Model/Cat no.: [id number]  Implant name: [place] MOBI-C CERVICAL 15X19 H6 - [id number] Laterality: N/A Area: Cervical Level 6-7  Manufacturer: [place] Date of Manufacture:    Action: Implanted Number Used: 1   Device Identifier:  Device Identifier Type:      SPECIMENS:None    ATTESTATION: I performed the procedure in its entirety with the assistance of Marin Olp, physician assistant

## 2019-03-21 NOTE — Anesthesia Postprocedure Evaluation (Signed)
Anesthesia Post Note  Patient: Marc Johnston  Procedure(s) Performed: CERVICAL ANTERIOR DISC ARTHROPLASTY C6-7 (N/A )  Anesthesia Type: General     Last Vitals:  Vitals:   03/21/19 1749 03/21/19 1752  BP:  133/78  Pulse: (!) 105 (!) 102  Resp:  19  Temp: (!) 36.4 C   SpO2: 97% 95%    Last Pain:  Vitals:   03/21/19 1749  PainSc: 0-No pain                 Molli Barrows

## 2019-03-21 NOTE — Anesthesia Procedure Notes (Signed)
Procedure Name: Intubation Performed by: Kelton Pillar, CRNA Pre-anesthesia Checklist: Patient identified, Emergency Drugs available, Suction available and Patient being monitored Patient Re-evaluated:Patient Re-evaluated prior to induction Oxygen Delivery Method: Circle system utilized Preoxygenation: Pre-oxygenation with 100% oxygen Induction Type: IV induction Ventilation: Two handed mask ventilation required Laryngoscope Size: McGraph and 3 Grade View: Grade I Tube type: Oral Tube size: 7.0 mm Number of attempts: 1 Airway Equipment and Method: Stylet and Video-laryngoscopy Placement Confirmation: ETT inserted through vocal cords under direct vision,  positive ETCO2,  CO2 detector and breath sounds checked- equal and bilateral Secured at: 21 cm Tube secured with: Tape Dental Injury: Teeth and Oropharynx as per pre-operative assessment

## 2019-03-21 NOTE — Anesthesia Preprocedure Evaluation (Signed)
Anesthesia Evaluation  Patient identified by MRN, date of birth, ID band Patient awake    Reviewed: Allergy & Precautions, NPO status , Patient's Chart, lab work & pertinent test results  History of Anesthesia Complications (+) Emergence Delirium and history of anesthetic complications  Airway Mallampati: II  TM Distance: >3 FB Neck ROM: Full    Dental no notable dental hx.    Pulmonary sleep apnea , neg COPD, Current SmokerPatient did not abstain from smoking.,    breath sounds clear to auscultation- rhonchi (-) wheezing      Cardiovascular hypertension, Pt. on medications (-) CAD, (-) Past MI, (-) Cardiac Stents and (-) CABG  Rhythm:Regular Rate:Normal - Systolic murmurs and - Diastolic murmurs    Neuro/Psych  Headaches, neg Seizures PSYCHIATRIC DISORDERS Depression    GI/Hepatic negative GI ROS, Neg liver ROS,   Endo/Other  negative endocrine ROSneg diabetes  Renal/GU negative Renal ROS     Musculoskeletal negative musculoskeletal ROS (+)   Abdominal (+) + obese,   Peds  Hematology negative hematology ROS (+)   Anesthesia Other Findings Past Medical History: No date: Allergy No date: Complication of anesthesia     Comment:  wake up aggressive if confused about where he is No date: Depression No date: Hypertension     Comment:  HCTZ 2005-2008. 05/05/2006: OSA (obstructive sleep apnea)     Comment:  Mild.  No CPAP warranted. No date: Periapical granuloma     Comment:  left mandible lateral per CT 07/2014   Reproductive/Obstetrics                             Anesthesia Physical Anesthesia Plan  ASA: II  Anesthesia Plan: General   Post-op Pain Management:    Induction: Intravenous  PONV Risk Score and Plan: 0 and Ondansetron and Midazolam  Airway Management Planned: Oral ETT  Additional Equipment:   Intra-op Plan:   Post-operative Plan: Extubation in OR  Informed  Consent: I have reviewed the patients History and Physical, chart, labs and discussed the procedure including the risks, benefits and alternatives for the proposed anesthesia with the patient or authorized representative who has indicated his/her understanding and acceptance.     Dental advisory given  Plan Discussed with: CRNA and Anesthesiologist  Anesthesia Plan Comments:         Anesthesia Quick Evaluation

## 2019-03-22 ENCOUNTER — Observation Stay: Payer: BLUE CROSS/BLUE SHIELD

## 2019-03-22 ENCOUNTER — Encounter: Payer: Self-pay | Admitting: Neurosurgery

## 2019-03-22 DIAGNOSIS — M4802 Spinal stenosis, cervical region: Secondary | ICD-10-CM | POA: Diagnosis not present

## 2019-03-22 DIAGNOSIS — G4733 Obstructive sleep apnea (adult) (pediatric): Secondary | ICD-10-CM | POA: Diagnosis not present

## 2019-03-22 DIAGNOSIS — Z79899 Other long term (current) drug therapy: Secondary | ICD-10-CM | POA: Diagnosis not present

## 2019-03-22 DIAGNOSIS — Z888 Allergy status to other drugs, medicaments and biological substances status: Secondary | ICD-10-CM | POA: Diagnosis not present

## 2019-03-22 DIAGNOSIS — M5010 Cervical disc disorder with radiculopathy, unspecified cervical region: Secondary | ICD-10-CM | POA: Diagnosis not present

## 2019-03-22 DIAGNOSIS — Z981 Arthrodesis status: Secondary | ICD-10-CM | POA: Diagnosis not present

## 2019-03-22 DIAGNOSIS — Z885 Allergy status to narcotic agent status: Secondary | ICD-10-CM | POA: Diagnosis not present

## 2019-03-22 DIAGNOSIS — I10 Essential (primary) hypertension: Secondary | ICD-10-CM | POA: Diagnosis not present

## 2019-03-22 DIAGNOSIS — Z79891 Long term (current) use of opiate analgesic: Secondary | ICD-10-CM | POA: Diagnosis not present

## 2019-03-22 DIAGNOSIS — M50123 Cervical disc disorder at C6-C7 level with radiculopathy: Secondary | ICD-10-CM | POA: Diagnosis not present

## 2019-03-22 DIAGNOSIS — F17200 Nicotine dependence, unspecified, uncomplicated: Secondary | ICD-10-CM | POA: Diagnosis not present

## 2019-03-22 MED ORDER — METHOCARBAMOL 750 MG PO TABS: 750.0000 mg | ORAL_TABLET | Freq: Four times a day (QID) | ORAL | 0 refills | Status: DC | PRN

## 2019-03-22 MED ORDER — NICOTINE 14 MG/24HR TD PT24: 14.0000 mg | MEDICATED_PATCH | Freq: Every day | TRANSDERMAL | 0 refills | Status: DC

## 2019-03-22 MED ORDER — HYDROCODONE-ACETAMINOPHEN 5-325 MG PO TABS: 1.0000 | ORAL_TABLET | Freq: Four times a day (QID) | ORAL | 0 refills | Status: DC | PRN

## 2019-03-22 NOTE — Progress Notes (Signed)
Discharge instructions reviewed with pt. Pt verbalized understanding of instructions. Personal belongings and discharge packet given to pt.

## 2019-03-22 NOTE — Discharge Summary (Signed)
Procedure: C6-7 ACDF Procedure date: 03/21/2019 Diagnosis: cervical radiculopathy   History: Marc Johnston is s/p C6-7 ACDF for cervical radiculopathy POD1: Recovering well. Complains of posterior neck pain rated 2/10. Upper extremity pain has improved. Denies new upper or lower pain/numbness/tingling/weakness. Ambulated, voided, and eating without issue.   POD0: Tolerated procedure well. Evaluated in post op recovery still disoriented from anesthesia but able to answer questions and obey commands.   Physical Exam: Vitals:   03/22/19 0410 03/22/19 0826  BP: 106/63 120/85  Pulse: 92 68  Resp: 16   Temp: 98.3 F (36.8 C) 97.8 F (36.6 C)  SpO2: 97% 99%   General: sitting upright on side of hospital bed eating breakfast. Alert and oriented.  Strength: 5/5 throughout upper and lower extremities.  Sensation: Intact and symmetric throughout upper and lower extremities.  Skin: Glue intact at incision site. No significant swelling noted.    Data:  Recent Labs  Lab 03/16/19 1025  NA 139  K 3.8  CL 106  CO2 26  BUN 12  CREATININE 0.60*  GLUCOSE 97  CALCIUM 9.0   No results for input(s): AST, ALT, ALKPHOS in the last 168 hours.  Invalid input(s): TBILI   Recent Labs  Lab 03/16/19 1025  WBC 7.3  HGB 15.1  HCT 45.7  PLT 126*   Recent Labs  Lab 03/16/19 1025  APTT 31  INR 1.1         Other tests/results:  EXAM: CERVICAL SPINE - 2-3 VIEW  COMPARISON:  Intraoperative cervical spine radiographs-03/21/2019  IMPRESSION: Post C6-C7 intervertebral disc space replacement without evidence of complication.  Assessment/Plan:  Marc Johnston is POD1 s/p C6-7 ACDF for cervical radiculopathy.  Patient is recovering well. He has ambulated, voided, and eaten without issue. Pain adequately controlled on current pain regimen. Will continue post op pain management with tylenol, muscle relaxer, and pain medication as needed. He is scheduled to follow up in clinic in  approximately 2 weeks. Advised to contact office if any questions or concerns arise before then.   Marin Olp PA-C Department of Neurosurgery

## 2019-03-22 NOTE — Discharge Instructions (Signed)

## 2019-04-19 DIAGNOSIS — M4802 Spinal stenosis, cervical region: Secondary | ICD-10-CM | POA: Diagnosis not present

## 2019-06-07 DIAGNOSIS — M4802 Spinal stenosis, cervical region: Secondary | ICD-10-CM | POA: Diagnosis not present

## 2019-06-07 DIAGNOSIS — Z981 Arthrodesis status: Secondary | ICD-10-CM | POA: Diagnosis not present

## 2019-07-11 DIAGNOSIS — G629 Polyneuropathy, unspecified: Secondary | ICD-10-CM | POA: Diagnosis not present

## 2019-07-11 DIAGNOSIS — E669 Obesity, unspecified: Secondary | ICD-10-CM | POA: Diagnosis not present

## 2019-08-16 DIAGNOSIS — G8929 Other chronic pain: Secondary | ICD-10-CM | POA: Diagnosis not present

## 2019-08-16 DIAGNOSIS — M545 Low back pain: Secondary | ICD-10-CM | POA: Diagnosis not present

## 2019-08-16 DIAGNOSIS — M5489 Other dorsalgia: Secondary | ICD-10-CM | POA: Diagnosis not present

## 2019-08-17 ENCOUNTER — Other Ambulatory Visit: Payer: Self-pay | Admitting: Student

## 2019-08-17 ENCOUNTER — Other Ambulatory Visit (HOSPITAL_COMMUNITY): Payer: Self-pay | Admitting: Student

## 2019-08-17 DIAGNOSIS — M545 Low back pain, unspecified: Secondary | ICD-10-CM

## 2019-08-17 DIAGNOSIS — G8929 Other chronic pain: Secondary | ICD-10-CM

## 2019-09-03 ENCOUNTER — Other Ambulatory Visit: Payer: Self-pay

## 2019-09-03 ENCOUNTER — Ambulatory Visit (HOSPITAL_COMMUNITY)
Admission: RE | Admit: 2019-09-03 | Discharge: 2019-09-03 | Disposition: A | Discharge status: Home / Self Care | Payer: BC Managed Care – PPO | Source: Ambulatory Visit | Attending: Student | Admitting: Student

## 2019-09-03 DIAGNOSIS — G8929 Other chronic pain: Secondary | ICD-10-CM | POA: Insufficient documentation

## 2019-09-03 DIAGNOSIS — M545 Low back pain, unspecified: Secondary | ICD-10-CM

## 2019-10-04 DIAGNOSIS — M5416 Radiculopathy, lumbar region: Secondary | ICD-10-CM | POA: Diagnosis not present

## 2019-10-04 DIAGNOSIS — M5136 Other intervertebral disc degeneration, lumbar region: Secondary | ICD-10-CM | POA: Diagnosis not present

## 2019-10-04 DIAGNOSIS — M1611 Unilateral primary osteoarthritis, right hip: Secondary | ICD-10-CM | POA: Diagnosis not present

## 2019-10-04 DIAGNOSIS — R1031 Right lower quadrant pain: Secondary | ICD-10-CM | POA: Diagnosis not present

## 2019-10-04 DIAGNOSIS — M5126 Other intervertebral disc displacement, lumbar region: Secondary | ICD-10-CM | POA: Diagnosis not present

## 2019-10-31 DIAGNOSIS — M1611 Unilateral primary osteoarthritis, right hip: Secondary | ICD-10-CM | POA: Diagnosis not present

## 2019-11-28 DIAGNOSIS — M5136 Other intervertebral disc degeneration, lumbar region: Secondary | ICD-10-CM | POA: Diagnosis not present

## 2019-11-28 DIAGNOSIS — M5416 Radiculopathy, lumbar region: Secondary | ICD-10-CM | POA: Diagnosis not present

## 2019-11-28 DIAGNOSIS — M5126 Other intervertebral disc displacement, lumbar region: Secondary | ICD-10-CM | POA: Diagnosis not present

## 2019-11-28 DIAGNOSIS — M1611 Unilateral primary osteoarthritis, right hip: Secondary | ICD-10-CM | POA: Diagnosis not present

## 2020-02-29 DIAGNOSIS — Z6833 Body mass index (BMI) 33.0-33.9, adult: Secondary | ICD-10-CM | POA: Diagnosis not present

## 2020-02-29 DIAGNOSIS — F17290 Nicotine dependence, other tobacco product, uncomplicated: Secondary | ICD-10-CM | POA: Diagnosis not present

## 2020-02-29 DIAGNOSIS — R7309 Other abnormal glucose: Secondary | ICD-10-CM | POA: Diagnosis not present

## 2020-02-29 DIAGNOSIS — I209 Angina pectoris, unspecified: Secondary | ICD-10-CM | POA: Diagnosis not present

## 2020-02-29 DIAGNOSIS — E7489 Other specified disorders of carbohydrate metabolism: Secondary | ICD-10-CM | POA: Diagnosis not present

## 2020-02-29 DIAGNOSIS — E669 Obesity, unspecified: Secondary | ICD-10-CM | POA: Diagnosis not present

## 2020-02-29 DIAGNOSIS — R079 Chest pain, unspecified: Secondary | ICD-10-CM | POA: Diagnosis not present

## 2020-02-29 DIAGNOSIS — Z719 Counseling, unspecified: Secondary | ICD-10-CM | POA: Diagnosis not present

## 2020-02-29 DIAGNOSIS — E6609 Other obesity due to excess calories: Secondary | ICD-10-CM | POA: Diagnosis not present

## 2020-02-29 DIAGNOSIS — Z0000 Encounter for general adult medical examination without abnormal findings: Secondary | ICD-10-CM | POA: Diagnosis not present

## 2020-02-29 DIAGNOSIS — Z1389 Encounter for screening for other disorder: Secondary | ICD-10-CM | POA: Diagnosis not present

## 2020-02-29 DIAGNOSIS — Z0001 Encounter for general adult medical examination with abnormal findings: Secondary | ICD-10-CM | POA: Diagnosis not present

## 2020-02-29 DIAGNOSIS — I10 Essential (primary) hypertension: Secondary | ICD-10-CM | POA: Diagnosis not present

## 2020-03-04 IMAGING — RF DG CERVICAL SPINE 2 OR 3 VIEWS
1 series · 8 of 8 positions shown · non-contrast
Comparison: 03/15/2019 report

CLINICAL DATA: Cervical anterior disc arthroplasty

EXAM:
CERVICAL SPINE - 2-3 VIEW

[Series 1: unknown protocol · 0.14mm/px · 8 of 8 slices shown]
[im 1/8]
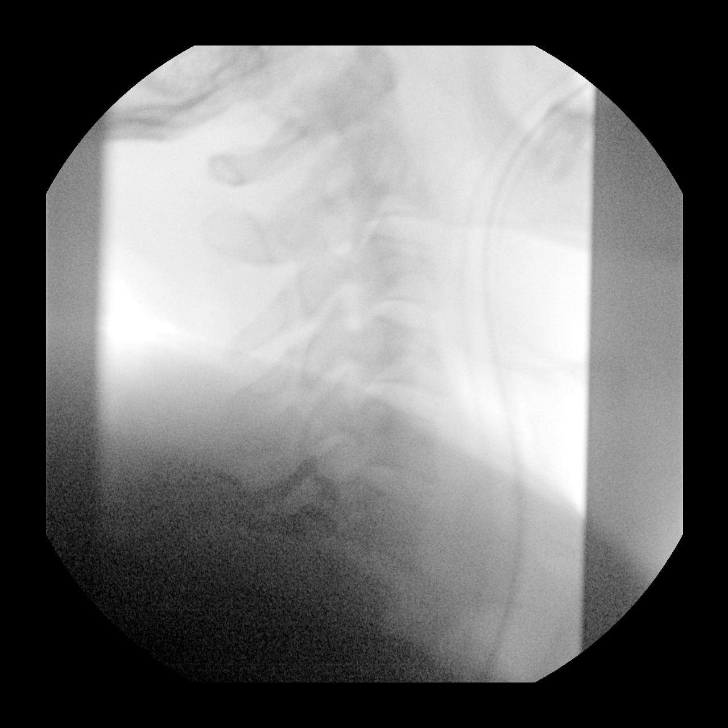
[im 2/8]
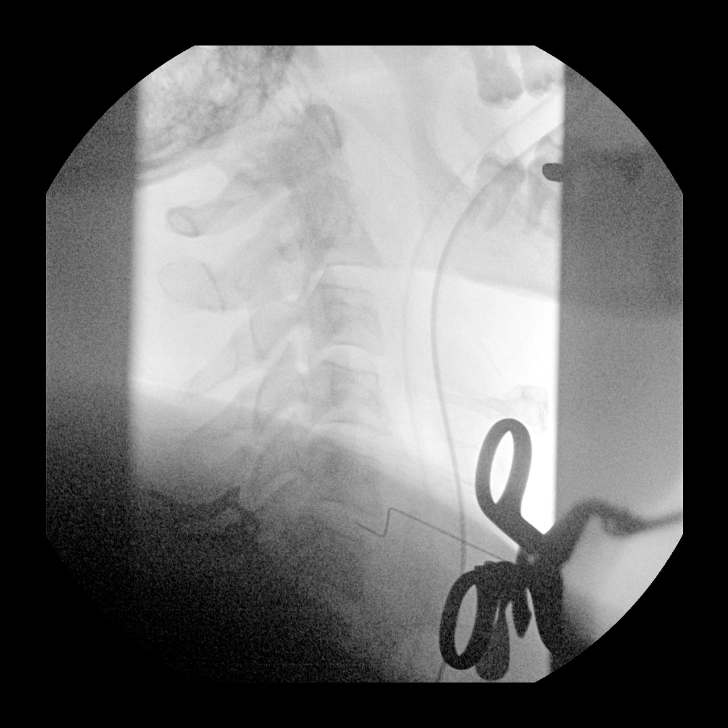
[im 3/8]
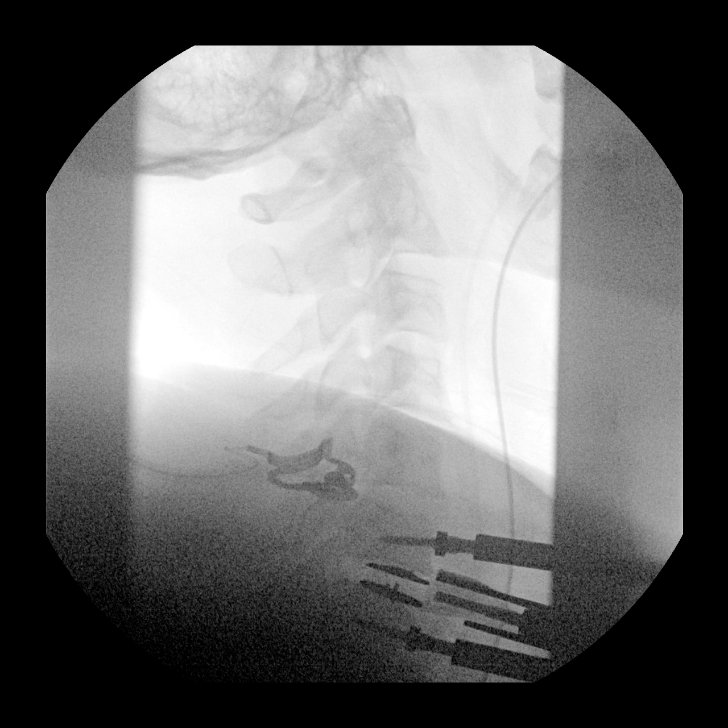
[im 4/8]
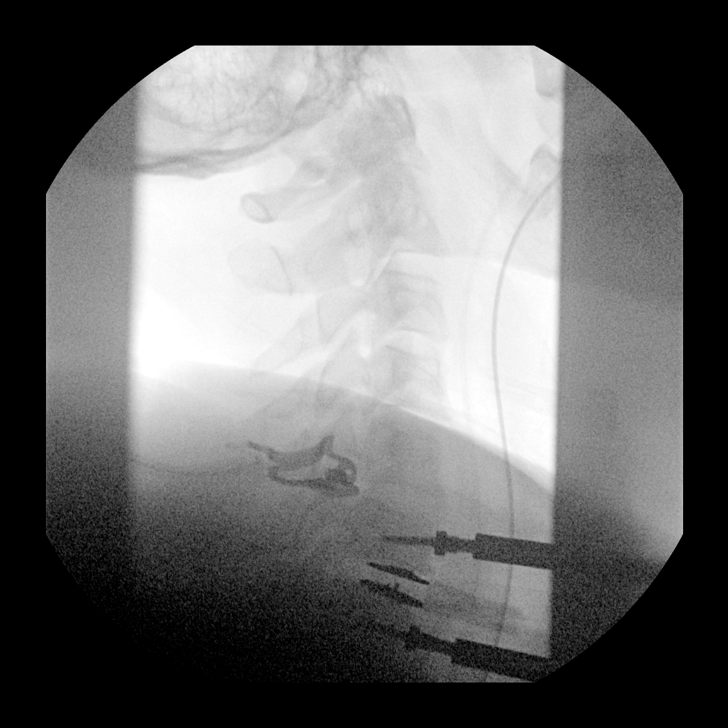
[im 5/8]
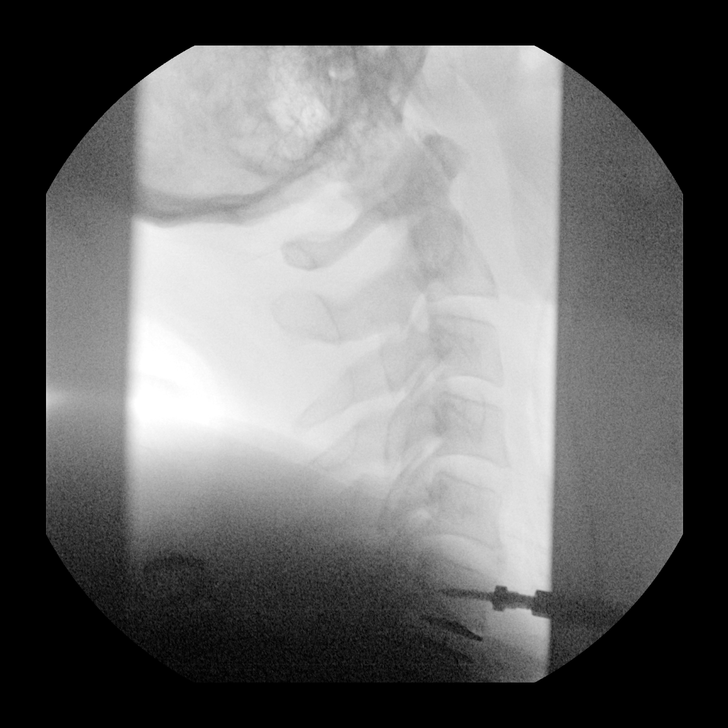
[im 6/8]
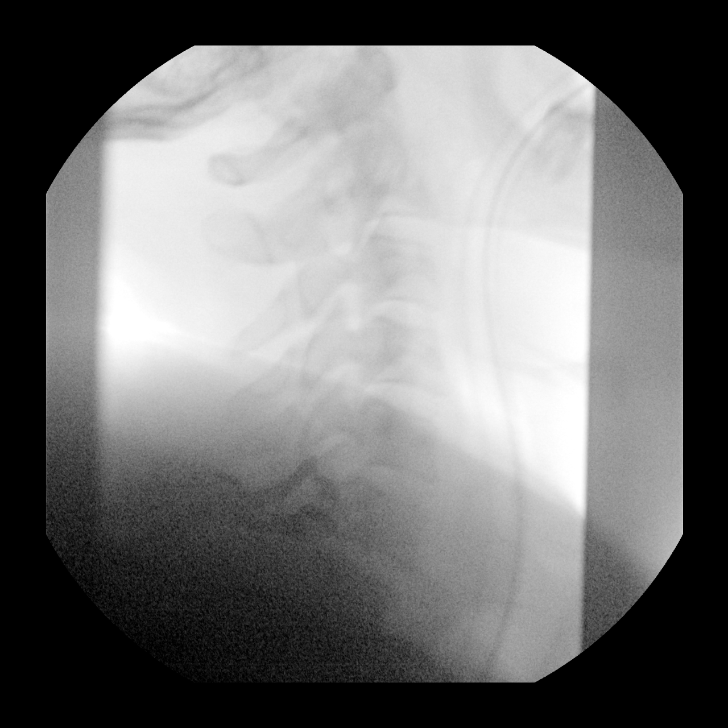
[im 7/8]
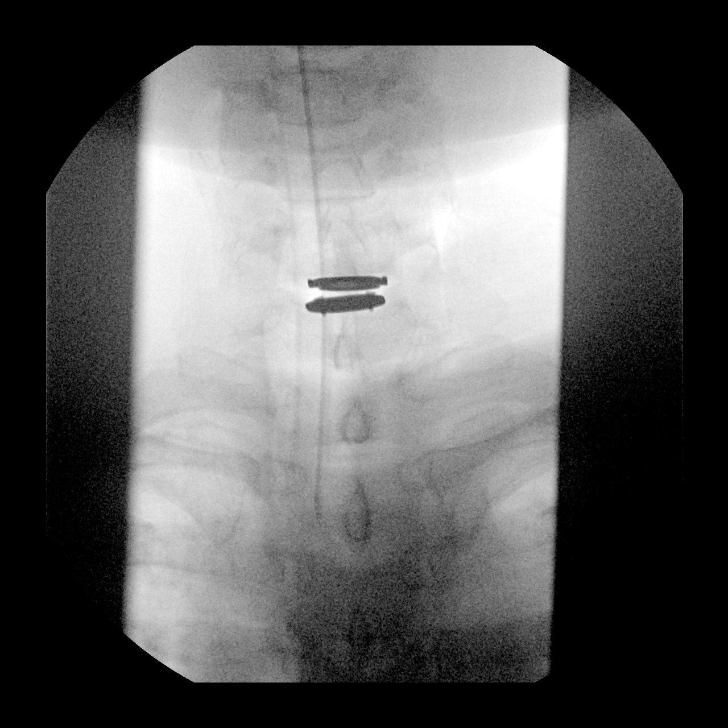
[im 8/8]
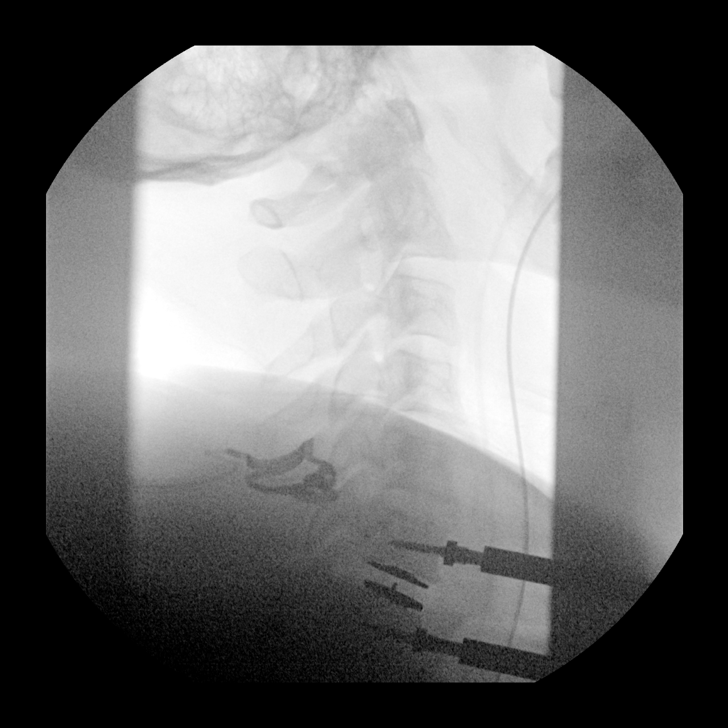

[8 of 8 positions shown; findings below may reference images not displayed]

FINDINGS: Eight low resolution intraoperative spot views of the cervical
spine. Total fluoroscopy time was 14.9 seconds. The images
demonstrate surgical instrumentation at C6-C7. An endotracheal tube
is noted.
IMPRESSION: Intraoperative fluoroscopic assistance provided during cervical
spine surgery

## 2020-03-16 ENCOUNTER — Encounter: Payer: Self-pay | Admitting: Internal Medicine

## 2020-03-16 ENCOUNTER — Ambulatory Visit (INDEPENDENT_AMBULATORY_CARE_PROVIDER_SITE_OTHER): Payer: BLUE CROSS/BLUE SHIELD | Admitting: Internal Medicine

## 2020-03-16 VITALS — BP 142/96 | HR 72 | Ht 70.0 in | Wt 249.8 lb

## 2020-03-16 DIAGNOSIS — R072 Precordial pain: Secondary | ICD-10-CM | POA: Diagnosis not present

## 2020-03-16 DIAGNOSIS — E785 Hyperlipidemia, unspecified: Secondary | ICD-10-CM

## 2020-03-16 DIAGNOSIS — Z01812 Encounter for preprocedural laboratory examination: Secondary | ICD-10-CM | POA: Diagnosis not present

## 2020-03-16 DIAGNOSIS — I10 Essential (primary) hypertension: Secondary | ICD-10-CM

## 2020-03-16 DIAGNOSIS — Z8249 Family history of ischemic heart disease and other diseases of the circulatory system: Secondary | ICD-10-CM | POA: Diagnosis not present

## 2020-03-16 MED ORDER — METOPROLOL TARTRATE 100 MG PO TABS: ORAL_TABLET | ORAL | 0 refills | Status: DC

## 2020-03-16 NOTE — Progress Notes (Signed)
OFFICE CONSULT NOTE  Chief Complaint:  Chest pain  Primary Care Physician: Marc Nevins, MD  HPI:  Marc Johnston is a 44 y.o. male who is being seen today for the evaluation of chest pain at the request of Marc Nevins, MD.  This is a pleasant 44 year old male kindly referred for evaluation management of chest pain.  He has a past medical history significant for hypertension, mild obstructive sleep apnea, not on CPAP, depression and family history of heart disease in his mother.  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  Recently he has been describing some chest pain.  He noted it to be worse when deploying the "landing gear" on his trailer hitch.  He also notes it when doing certain activities and feels that the chest pain radiates across his chest.  He has been struggling with right shoulder pain and has a history of cervical pain status post surgery as well as low back pain.  He says he does not get short of breath or have chest pain with exertion or sexual activity.  EKG today shows a normal sinus rhythm at 73 with lateral and high lateral ST depression and T wave changes (compared to a prior study in 2017 this is unchanged).  Lab work from October showed total cholesterol 211, HDL 37, LDL 138 and triglycerides 373.  A1c of 5.8.  At the time the labs were drawn and he saw his PCP he was started on low-dose aspirin, 20 mg atorvastatin and losartan 25 mg.  Pressure today was 142/96.  PMHx:  Past Medical History:  Diagnosis Date  . Allergy   . Complication of anesthesia    wake up aggressive if confused about where he is  . Depression   . Hypertension    HCTZ 2005-2008.  [name] OSA (obstructive sleep apnea) 05/05/2006   Mild.  No CPAP warranted.  . Periapical granuloma    left mandible lateral per CT 07/2014    Past Surgical History:  Procedure Laterality Date  . CERVICAL DISC ARTHROPLASTY N/A 03/21/2019   Procedure: CERVICAL  ANTERIOR DISC ARTHROPLASTY C6-7;  Surgeon: Lucy Chris, MD;  Location: [facility] ORS;  Service: Neurosurgery;  Laterality: N/A;  . FINGER SURGERY Right    as a child  reattached 2nd finger   . NASAL SINUS SURGERY      FAMHx:  Family History  Problem Relation Age of Onset  . Diabetes Father   . Heart disease Father   . Heart disease Mother     SOCHx:   reports that he has been smoking. He has never used smokeless tobacco. He reports current alcohol use. He reports that he does not use drugs.  ALLERGIES:  Allergies  Allergen Reactions  . Morphine Other (See Comments)    Severe agitation  . Guaifenesin Other (See Comments)    unknown    ROS: Pertinent items noted in HPI and remainder of comprehensive ROS otherwise negative.  HOME MEDS: Current Outpatient Medications on File Prior to Visit  Medication Sig Dispense Refill  . aspirin EC 81 MG tablet Take 81 mg by mouth daily. Swallow whole.    [name] atorvastatin (LIPITOR) 20 MG tablet Take 20 mg by mouth daily.    [name] losartan (COZAAR) 25 MG tablet Take 25 mg by mouth daily.     No current facility-administered medications on file prior to visit.    LABS/IMAGING: No results found for this or any previous  visit (from the past 48 hour(s)). No results found.  LIPID PANEL:    Component Value Date/Time   CHOL 161 07/31/2014 1800   TRIG 142 07/31/2014 1800   HDL 44 07/31/2014 1800   CHOLHDL 3.7 07/31/2014 1800   VLDL 28 07/31/2014 1800   LDLCALC 89 07/31/2014 1800    WEIGHTS: Wt Readings from Last 3 Encounters:  03/16/20 249 lb 12.8 oz (113.3 kg)  03/21/19 250 lb 14.4 oz (113.8 kg)  03/16/19 256 lb 8 oz (116.3 kg)    VITALS: BP (!) 142/96 (BP Location: Left Arm, Patient Position: Sitting)   Pulse 72   Ht 5\' 10"  (1.778 m)   Wt 249 lb 12.8 oz (113.3 kg)   SpO2 98%   BMI 35.84 kg/m   EXAM: General appearance: alert and no distress Neck: no carotid bruit, no JVD and thyroid not enlarged, symmetric, no  tenderness/mass/nodules Lungs: clear to auscultation bilaterally Heart: regular rate and rhythm Abdomen: soft, non-tender; bowel sounds normal; no masses,  no organomegaly and obese Extremities: extremities normal, atraumatic, no cyanosis or edema Pulses: 2+ and symmetric Skin: Skin color, texture, turgor normal. No rashes or lesions Neurologic: Grossly normal Psych: Pleasant  EKG: Normal sinus rhythm at 73, lateral and high lateral ST depression downsloping and T wave changes-personally reviewed  ASSESSMENT: 1. Chest pain with typical and atypical features 2. Abnormal EKG, possibly ischemic 3. Hypertension 4. Dyslipidemia 5. Tobacco abuse 6. Family history of coronary disease  PLAN: 1.   [name] is describing chest pain with some typical and atypical features.  His EKG is abnormal and possibly ischemic although is somewhat similar to a prior EKG several years ago.  Blood pressure is a little elevated however he recently started on medication for this as well as aspirin and a statin and he is noted to have some moderate dyslipidemia.  He is a smoker and given his family history of heart disease and other risk factors I do have some concern for coronary artery disease.  His PCP also got labs including a troponin which was negative at point-of-care.  I would recommend further ischemia evaluation will plan for CT coronary angiogram.  He will likely need 100 mg metoprolol prior to the procedure and nitroglycerin for best imaging.  Plan follow-up with me afterwards to discuss the findings.  Thanks as always for the kind referral.  Erling Conte, MD, Chrystie Nose  [place]  [facility]  Medical Director of the Advanced Lipid Disorders &  Cardiovascular Risk Reduction Clinic Diplomate of the American Board of Clinical Lipidology Attending Cardiologist  Direct Dial: [phone]  Fax: [phone]  Website:  [email or web address] Dodie Parisi 03/16/2020, 9:35 PM

## 2020-03-16 NOTE — Patient Instructions (Addendum)
Medication Instructions:  Your physician recommends that you continue on your current medications as directed. Please refer to the Current Medication list given to you today.  *If you need a refill on your cardiac medications before your next appointment, please call your pharmacy*   Lab Work: BMET - prior to CT test   If you have labs (blood work) drawn today and your tests are completely normal, you will receive your results only by: [name] MyChart Message (if you have MyChart) OR . A paper copy in the mail If you have any lab test that is abnormal or we need to change your treatment, we will call you to review the results.   Testing/Procedures: Coronary CT study at [place]: At [facility], you and your health needs are our priority.  As part of our continuing mission to provide you with exceptional heart care, we have created designated Provider Care Teams.  These Care Teams include your primary Cardiologist (physician) and Advanced Practice Providers (APPs -  Physician Assistants and Nurse Practitioners) who all work together to provide you with the care you need, when you need it.  We recommend signing up for the patient portal called "MyChart".  Sign up information is provided on this After Visit Summary.  MyChart is used to connect with patients for Virtual Visits (Telemedicine).  Patients are able to view lab/test results, encounter notes, upcoming appointments, etc.  Non-urgent messages can be sent to your provider as well.   To learn more about what you can do with MyChart, go to [email or web address].    Your next appointment:   6 week(s)  The format for your next appointment:   In Person  Provider:   You may see Dr. Debara Pickett or one of the following Advanced Practice Providers on your designated Care Team:    Almyra Deforest, PA-C  Fabian Sharp, [place] or   Roby Lofts, [place]    Other Instructions Your cardiac CT will be scheduled at one of the below  locations:   [facility] [address] [place], [place] [address] [phone]  [place] [address] [place], [place] [address] [phone]  If scheduled at [facility], please arrive at the [facility] main entrance of [facility] 30 minutes prior to test start time. Proceed to the [facility] Radiology Department (first floor) to check-in and test prep.  If scheduled at [facility], please arrive 15 mins early for check-in and test prep.  Please follow these instructions carefully (unless otherwise directed):  Hold all erectile dysfunction medications at least 3 days (72 hrs) prior to test.  On the Night Before the Test: . Be sure to Drink plenty of water. . Do not consume any caffeinated/decaffeinated beverages or chocolate 12 hours prior to your test. . Do not take any antihistamines 12 hours prior to your test.  On the Day of the Test: . Drink plenty of water. Do not drink any water within one hour of the test. . Do not eat any food 4 hours prior to the test. . You may take your regular medications prior to the test.  . Take metoprolol (Lopressor) two hours prior to test.      After the Test: . Drink plenty of water. . After receiving IV contrast, you may experience a mild flushed feeling. This is normal. . On occasion, you may experience a mild rash up to 24  hours after the test. This is not dangerous. If this occurs, you can take Benadryl 25 mg and increase your fluid intake. . If you experience trouble breathing, this can be serious. If it is severe call 911 IMMEDIATELY. If it is mild, please call our office. . If you take any of these medications: Glipizide/Metformin, Avandament, Glucavance, please do not take 48 hours after completing test unless otherwise instructed.   Once we have confirmed authorization from your insurance company, we will call you to  set up a date and time for your test. Based on how quickly your insurance processes prior authorizations requests, please allow up to 4 weeks to be contacted for scheduling your Cardiac CT appointment. Be advised that routine Cardiac CT appointments could be scheduled as many as 8 weeks after your provider has ordered it.  For non-scheduling related questions, please contact the cardiac imaging nurse navigator should you have any questions/concerns: Marchia Bond, Cardiac Imaging Nurse Navigator Burley Saver, Interim Cardiac Imaging Nurse [place] and V[facility] Services Direct Office Dial: [phone]   For scheduling needs, including cancellations and rescheduling, please call [place], [phone] (temporary number).

## 2020-03-16 NOTE — H&P (View-Only) (Signed)
 OFFICE CONSULT NOTE  Chief Complaint:  Chest pain  Primary Care Physician: Johnston, Lawrence, MD  HPI:  Marc Johnston is a 44 y.o. male who is being seen today for the evaluation of chest pain at the request of Johnston, Lawrence, MD.  This is a pleasant 44-year-old male kindly referred for evaluation management of chest pain.  He has a past medical history significant for hypertension, mild obstructive sleep apnea, not on CPAP, depression and family history of heart disease in his mother.  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  Recently he has been describing some chest pain.  He noted it to be worse when deploying the "landing gear" on his trailer hitch.  He also notes it when doing certain activities and feels that the chest pain radiates across his chest.  He has been struggling with right shoulder pain and has a history of cervical pain status post surgery as well as low back pain.  He says he does not get short of breath or have chest pain with exertion or sexual activity.  EKG today shows a normal sinus rhythm at 73 with lateral and high lateral ST depression and T wave changes (compared to a prior study in 2017 this is unchanged).  Lab work from October showed total cholesterol 211, HDL 37, LDL 138 and triglycerides 198.  A1c of 5.8.  At the time the labs were drawn and he saw his PCP he was started on low-dose aspirin, 20 mg atorvastatin and losartan 25 mg.  Pressure today was 142/96.  PMHx:  Past Medical History:  Diagnosis Date  . Allergy   . Complication of anesthesia    wake up aggressive if confused about where he is  . Depression   . Hypertension    HCTZ 2005-2008.  . OSA (obstructive sleep apnea) 05/05/2006   Mild.  No CPAP warranted.  . Periapical granuloma    left mandible lateral per CT 07/2014    Past Surgical History:  Procedure Laterality Date  . CERVICAL DISC ARTHROPLASTY N/A 03/21/2019   Procedure: CERVICAL  ANTERIOR DISC ARTHROPLASTY C6-7;  Surgeon: Johnston, Steven, MD;  Location: [facility] ORS;  Service: Neurosurgery;  Laterality: N/A;  . FINGER SURGERY Right    as a child  reattached 2nd finger   . NASAL SINUS SURGERY      FAMHx:  Family History  Problem Relation Age of Onset  . Diabetes Father   . Heart disease Father   . Heart disease Mother     SOCHx:   reports that he has been smoking. He has never used smokeless tobacco. He reports current alcohol use. He reports that he does not use drugs.  ALLERGIES:  Allergies  Allergen Reactions  . Morphine Other (See Comments)    Severe agitation  . Guaifenesin Other (See Comments)    unknown    ROS: Pertinent items noted in HPI and remainder of comprehensive ROS otherwise negative.  HOME MEDS: Current Outpatient Medications on File Prior to Visit  Medication Sig Dispense Refill  . aspirin EC 81 MG tablet Take 81 mg by mouth daily. Swallow whole.    . atorvastatin (LIPITOR) 20 MG tablet Take 20 mg by mouth daily.    . losartan (COZAAR) 25 MG tablet Take 25 mg by mouth daily.     No current facility-administered medications on file prior to visit.    LABS/IMAGING: No results found for this or any previous   visit (from the past 48 hour(s)). No results found.  LIPID PANEL:    Component Value Date/Time   CHOL 161 07/31/2014 1800   TRIG 142 07/31/2014 1800   HDL 44 07/31/2014 1800   CHOLHDL 3.7 07/31/2014 1800   VLDL 28 07/31/2014 1800   LDLCALC 89 07/31/2014 1800    WEIGHTS: Wt Readings from Last 3 Encounters:  03/16/20 249 lb 12.8 oz (113.3 kg)  03/21/19 250 lb 14.4 oz (113.8 kg)  03/16/19 256 lb 8 oz (116.3 kg)    VITALS: BP (!) 142/96 (BP Location: Left Arm, Patient Position: Sitting)   Pulse 72   Ht 5\' 10"  (1.778 m)   Wt 249 lb 12.8 oz (113.3 kg)   SpO2 98%   BMI 35.84 kg/m   EXAM: General appearance: alert and no distress Neck: no carotid bruit, no JVD and thyroid not enlarged, symmetric, no  tenderness/mass/nodules Lungs: clear to auscultation bilaterally Heart: regular rate and rhythm Abdomen: soft, non-tender; bowel sounds normal; no masses,  no organomegaly and obese Extremities: extremities normal, atraumatic, no cyanosis or edema Pulses: 2+ and symmetric Skin: Skin color, texture, turgor normal. No rashes or lesions Neurologic: Grossly normal Psych: Pleasant  EKG: Normal sinus rhythm at 73, lateral and high lateral ST depression downsloping and T wave changes-personally reviewed  ASSESSMENT: 1. Chest pain with typical and atypical features 2. Abnormal EKG, possibly ischemic 3. Hypertension 4. Dyslipidemia 5. Tobacco abuse 6. Family history of coronary disease  PLAN: 1.   [name] is describing chest pain with some typical and atypical features.  His EKG is abnormal and possibly ischemic although is somewhat similar to a prior EKG several years ago.  Blood pressure is a little elevated however he recently started on medication for this as well as aspirin and a statin and he is noted to have some moderate dyslipidemia.  He is a smoker and given his family history of heart disease and other risk factors I do have some concern for coronary artery disease.  His PCP also got labs including a troponin which was negative at point-of-care.  I would recommend further ischemia evaluation will plan for CT coronary angiogram.  He will likely need 100 mg metoprolol prior to the procedure and nitroglycerin for best imaging.  Plan follow-up with me afterwards to discuss the findings.  Thanks as always for the kind referral.  Erling Conte, MD, Chrystie Nose  [place]  Medical Director of the [facility] Lipid Disorders &  Cardiovascular Risk Reduction Clinic Diplomate of the American Board of Clinical Lipidology Attending Cardiologist  Direct Dial: [phone]  Fax: [phone]  Website:  [email or web address] Keyonda Bickle 03/16/2020, 9:35 PM

## 2020-03-20 ENCOUNTER — Telehealth: Payer: Self-pay | Admitting: Internal Medicine

## 2020-03-20 DIAGNOSIS — R0789 Other chest pain: Secondary | ICD-10-CM | POA: Diagnosis not present

## 2020-03-20 DIAGNOSIS — R079 Chest pain, unspecified: Secondary | ICD-10-CM | POA: Diagnosis not present

## 2020-03-20 NOTE — Telephone Encounter (Signed)
Returned call to patient. He reports chest pain and shortness of breath are getting worse. Symptoms have worsened since Saturday. [place] was [place] [employer] [place] to his car on Saturday and had to stop and rest due chest pain and SOB. He rested with resolution of symptoms. 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 Pt does not have access to nitro. The worsening symptoms concern him. PCP told him to call cardiologist.   I advised him to be evaluated at the ER for exertional chest pain/dyspnea relieved with rest. I will send a note to Dr. Rennis Golden.

## 2020-03-20 NOTE — Telephone Encounter (Signed)
Episodes getting worse  Pt c/o of Chest Pain: STAT if CP now or developed within 24 hours  1. Are you having CP right now? no  2. Are you experiencing any other symptoms (ex. SOB, nausea, vomiting, sweating)? SOB  3. How long have you been experiencing CP? Over a year  4. Is your CP continuous or coming and going? Comes and goes  5. Have you taken Nitroglycerin? no   Patient states his chest pain episodes have been getting worse. He states he is not having any symptoms now. ?

## 2020-03-21 ENCOUNTER — Telehealth: Payer: Self-pay | Admitting: Internal Medicine

## 2020-03-21 ENCOUNTER — Telehealth (HOSPITAL_COMMUNITY): Payer: Self-pay | Admitting: *Deleted

## 2020-03-21 DIAGNOSIS — R9431 Abnormal electrocardiogram [ECG] [EKG]: Secondary | ICD-10-CM | POA: Diagnosis not present

## 2020-03-21 NOTE — Telephone Encounter (Signed)
Thanks .. I agree with the recommendation to go to the ER if symptoms worsen or are unstable. Cannot predict when outpatient coronary CT can get done.  Dr. Rexene Edison

## 2020-03-21 NOTE — Telephone Encounter (Signed)
Glad the work-up was negative- he is still scheduled to have a CT coronary angiogram. If he wishes to have a work excuse, I can provide it until after he sees me in follow-up of his CT.  If that is negative, then he could be cleared after to return to work.  Dr. Jory Sims patient and made aware of the following. He states he is trying to figure out the best time to schedule his coronary CT. Pt states he would like a work note for the time being.   Will route to Dr. Blanchie Dessert nurse.

## 2020-03-21 NOTE — Telephone Encounter (Signed)
New message:    Patient calling to report what the hospital told him yesterday. Patient boss would also like to know if patient will be out of work. Please call patient

## 2020-03-21 NOTE — Telephone Encounter (Signed)
Glad the work-up was negative- he is still scheduled to have a CT coronary angiogram. If he wishes to have a work excuse, I can provide it until after he sees me in follow-up of his CT.  If that is negative, then he could be cleared after to return to work.  Dr. Rexene Edison

## 2020-03-21 NOTE — Telephone Encounter (Signed)
Reaching out to patient to offer assistance regarding upcoming cardiac imaging study; pt verbalizes understanding of appt date/time, parking situation and where to check in, pre-test NPO status and medications ordered, and verified current allergies; name and call back number provided for further questions should they arise ° °April Colter Tai RN Navigator Cardiac Imaging °[facility] °[phone] office °[phone] cell ° °

## 2020-03-21 NOTE — Telephone Encounter (Signed)
Message routed to MD as Lorain Childes  Message sent to pre-cert team to see if his coronary CT can be expedited given symptoms  He has PA follow up on 04/24/20

## 2020-03-21 NOTE — Telephone Encounter (Signed)
Patient scheduled for CT tomorrow 03/22/2020

## 2020-03-21 NOTE — Telephone Encounter (Signed)
Patient states he went to the ED yesterday with chest pain. He received an EKG and blood work was drawn. He was sent home as the wait was 4-6 hours because they were understaffed and there was nothing abnormal seen in results.   Patient states he was told in the ED that the pain may be muscular.   Patient states that his boss is now wondering if he is going to be written out of work as the episodes of chest pain have been happening at work.  Patient states that Dr. Rennis Golden has told him in the past that these episodes may be coming from a previous neck surgery.   Patient would like to know if he should stay out of work and rest and what the next steps are.

## 2020-03-21 NOTE — Telephone Encounter (Signed)
Pre-cert team expedited his approval w/insurance and should be scheduling him for coronary CT soon

## 2020-03-22 ENCOUNTER — Inpatient Hospital Stay (HOSPITAL_COMMUNITY)
Admission: RE | Admit: 2020-03-22 | Discharge: 2020-03-22 | Disposition: A | Discharge status: Home / Self Care | Payer: BLUE CROSS/BLUE SHIELD | Source: Ambulatory Visit | Attending: Internal Medicine | Admitting: Internal Medicine

## 2020-03-22 ENCOUNTER — Other Ambulatory Visit: Payer: Self-pay

## 2020-03-22 ENCOUNTER — Encounter: Payer: Self-pay | Admitting: Internal Medicine

## 2020-03-22 DIAGNOSIS — R072 Precordial pain: Secondary | ICD-10-CM | POA: Insufficient documentation

## 2020-03-22 DIAGNOSIS — Z01812 Encounter for preprocedural laboratory examination: Secondary | ICD-10-CM

## 2020-03-22 DIAGNOSIS — Z8249 Family history of ischemic heart disease and other diseases of the circulatory system: Secondary | ICD-10-CM

## 2020-03-22 DIAGNOSIS — E785 Hyperlipidemia, unspecified: Secondary | ICD-10-CM | POA: Insufficient documentation

## 2020-03-22 DIAGNOSIS — I2510 Atherosclerotic heart disease of native coronary artery without angina pectoris: Secondary | ICD-10-CM | POA: Diagnosis not present

## 2020-03-22 MED ORDER — IOHEXOL 350 MG/ML SOLN
80.0000 mL | Freq: Once | INTRAVENOUS | Status: AC | PRN
  Administered 2020-03-22: 80 mL via INTRAVENOUS

## 2020-03-22 MED ORDER — NITROGLYCERIN 0.4 MG SL SUBL
SUBLINGUAL_TABLET | SUBLINGUAL | Status: AC
  Filled 2020-03-22: qty 2

## 2020-03-22 MED ORDER — NITROGLYCERIN 0.4 MG SL SUBL
0.8000 mg | SUBLINGUAL_TABLET | Freq: Once | SUBLINGUAL | Status: AC
  Administered 2020-03-22: 0.8 mg via SUBLINGUAL

## 2020-03-23 ENCOUNTER — Inpatient Hospital Stay (HOSPITAL_COMMUNITY)
Admission: RE | Admit: 2020-03-23 | Discharge: 2020-03-30 | DRG: 234 | Disposition: A | Discharge status: Home / Self Care | Payer: BLUE CROSS/BLUE SHIELD | Attending: Thoracic Surgery (Cardiothoracic Vascular Surgery) | Admitting: Thoracic Surgery (Cardiothoracic Vascular Surgery)

## 2020-03-23 ENCOUNTER — Encounter (HOSPITAL_COMMUNITY)
Admission: RE | Disposition: A | Discharge status: Home / Self Care | Payer: Self-pay | Attending: Thoracic Surgery (Cardiothoracic Vascular Surgery)

## 2020-03-23 ENCOUNTER — Encounter (HOSPITAL_COMMUNITY): Payer: Self-pay | Admitting: Internal Medicine

## 2020-03-23 ENCOUNTER — Telehealth: Payer: Self-pay | Admitting: Internal Medicine

## 2020-03-23 ENCOUNTER — Encounter: Payer: Self-pay | Admitting: Internal Medicine

## 2020-03-23 DIAGNOSIS — E785 Hyperlipidemia, unspecified: Secondary | ICD-10-CM | POA: Diagnosis present

## 2020-03-23 DIAGNOSIS — Z8249 Family history of ischemic heart disease and other diseases of the circulatory system: Secondary | ICD-10-CM | POA: Diagnosis not present

## 2020-03-23 DIAGNOSIS — I200 Unstable angina: Secondary | ICD-10-CM | POA: Diagnosis not present

## 2020-03-23 DIAGNOSIS — Z7982 Long term (current) use of aspirin: Secondary | ICD-10-CM | POA: Diagnosis not present

## 2020-03-23 DIAGNOSIS — J939 Pneumothorax, unspecified: Secondary | ICD-10-CM

## 2020-03-23 DIAGNOSIS — I25110 Atherosclerotic heart disease of native coronary artery with unstable angina pectoris: Secondary | ICD-10-CM

## 2020-03-23 DIAGNOSIS — J9811 Atelectasis: Secondary | ICD-10-CM | POA: Diagnosis not present

## 2020-03-23 DIAGNOSIS — R21 Rash and other nonspecific skin eruption: Secondary | ICD-10-CM | POA: Diagnosis not present

## 2020-03-23 DIAGNOSIS — I088 Other rheumatic multiple valve diseases: Secondary | ICD-10-CM | POA: Diagnosis not present

## 2020-03-23 DIAGNOSIS — Z20822 Contact with and (suspected) exposure to covid-19: Secondary | ICD-10-CM | POA: Diagnosis present

## 2020-03-23 DIAGNOSIS — N189 Chronic kidney disease, unspecified: Secondary | ICD-10-CM | POA: Diagnosis present

## 2020-03-23 DIAGNOSIS — F32A Depression, unspecified: Secondary | ICD-10-CM | POA: Diagnosis not present

## 2020-03-23 DIAGNOSIS — R079 Chest pain, unspecified: Secondary | ICD-10-CM

## 2020-03-23 DIAGNOSIS — Z833 Family history of diabetes mellitus: Secondary | ICD-10-CM

## 2020-03-23 DIAGNOSIS — Z79899 Other long term (current) drug therapy: Secondary | ICD-10-CM | POA: Diagnosis not present

## 2020-03-23 DIAGNOSIS — Z01810 Encounter for preprocedural cardiovascular examination: Secondary | ICD-10-CM | POA: Diagnosis not present

## 2020-03-23 DIAGNOSIS — Z452 Encounter for adjustment and management of vascular access device: Secondary | ICD-10-CM

## 2020-03-23 DIAGNOSIS — I517 Cardiomegaly: Secondary | ICD-10-CM | POA: Diagnosis not present

## 2020-03-23 DIAGNOSIS — I129 Hypertensive chronic kidney disease with stage 1 through stage 4 chronic kidney disease, or unspecified chronic kidney disease: Secondary | ICD-10-CM | POA: Diagnosis not present

## 2020-03-23 DIAGNOSIS — Z951 Presence of aortocoronary bypass graft: Secondary | ICD-10-CM

## 2020-03-23 DIAGNOSIS — Z885 Allergy status to narcotic agent status: Secondary | ICD-10-CM

## 2020-03-23 DIAGNOSIS — G4733 Obstructive sleep apnea (adult) (pediatric): Secondary | ICD-10-CM | POA: Diagnosis present

## 2020-03-23 DIAGNOSIS — Z888 Allergy status to other drugs, medicaments and biological substances status: Secondary | ICD-10-CM

## 2020-03-23 DIAGNOSIS — F17200 Nicotine dependence, unspecified, uncomplicated: Secondary | ICD-10-CM | POA: Diagnosis present

## 2020-03-23 DIAGNOSIS — I2510 Atherosclerotic heart disease of native coronary artery without angina pectoris: Secondary | ICD-10-CM | POA: Diagnosis not present

## 2020-03-23 DIAGNOSIS — I10 Essential (primary) hypertension: Secondary | ICD-10-CM | POA: Diagnosis not present

## 2020-03-23 DIAGNOSIS — I25119 Atherosclerotic heart disease of native coronary artery with unspecified angina pectoris: Secondary | ICD-10-CM

## 2020-03-23 HISTORY — PX: LEFT HEART CATH AND CORONARY ANGIOGRAPHY: CATH118249

## 2020-03-23 HISTORY — PX: INTRAVASCULAR PRESSURE WIRE/FFR STUDY: CATH118243

## 2020-03-23 LAB — SARS CORONAVIRUS 2 BY RT PCR (HOSPITAL ORDER, PERFORMED IN ~~LOC~~ HOSPITAL LAB): SARS Coronavirus 2: NEGATIVE

## 2020-03-23 SURGERY — LEFT HEART CATH AND CORONARY ANGIOGRAPHY
Anesthesia: LOCAL

## 2020-03-23 MED ORDER — LIDOCAINE HCL (PF) 1 % IJ SOLN
INTRAMUSCULAR | Status: DC | PRN
  Administered 2020-03-23: 2 mL

## 2020-03-23 MED ORDER — IOHEXOL 350 MG/ML SOLN
INTRAVENOUS | Status: AC
  Filled 2020-03-23: qty 1

## 2020-03-23 MED ORDER — NITROGLYCERIN 1 MG/10 ML FOR IR/CATH LAB
INTRA_ARTERIAL | Status: AC
  Filled 2020-03-23: qty 10

## 2020-03-23 MED ORDER — ASPIRIN 81 MG PO CHEW
81.0000 mg | CHEWABLE_TABLET | ORAL | Status: AC
  Administered 2020-03-23: 81 mg via ORAL
  Filled 2020-03-23: qty 1

## 2020-03-23 MED ORDER — NITROGLYCERIN 1 MG/10 ML FOR IR/CATH LAB
INTRA_ARTERIAL | Status: DC | PRN
  Administered 2020-03-23: 200 ug via INTRACORONARY

## 2020-03-23 MED ORDER — HYDRALAZINE HCL 20 MG/ML IJ SOLN: 10.0000 mg | INTRAMUSCULAR | Status: AC | PRN

## 2020-03-23 MED ORDER — METOPROLOL TARTRATE 12.5 MG HALF TABLET
12.5000 mg | ORAL_TABLET | Freq: Two times a day (BID) | ORAL | Status: DC
  Administered 2020-03-23 – 2020-03-25 (×5): 12.5 mg via ORAL
  Filled 2020-03-23 (×5): qty 1

## 2020-03-23 MED ORDER — HEPARIN (PORCINE) IN NACL 1000-0.9 UT/500ML-% IV SOLN
INTRAVENOUS | Status: AC
  Filled 2020-03-23: qty 1000

## 2020-03-23 MED ORDER — HEPARIN SODIUM (PORCINE) 1000 UNIT/ML IJ SOLN
INTRAMUSCULAR | Status: AC
  Filled 2020-03-23: qty 1

## 2020-03-23 MED ORDER — SODIUM CHLORIDE 0.9% FLUSH: 3.0000 mL | Freq: Two times a day (BID) | INTRAVENOUS | Status: DC

## 2020-03-23 MED ORDER — ENOXAPARIN SODIUM 40 MG/0.4ML ~~LOC~~ SOLN
40.0000 mg | SUBCUTANEOUS | Status: DC
  Administered 2020-03-24 – 2020-03-25 (×2): 40 mg via SUBCUTANEOUS
  Filled 2020-03-23 (×2): qty 0.4

## 2020-03-23 MED ORDER — SODIUM CHLORIDE 0.9 % IV SOLN: 250.0000 mL | INTRAVENOUS | Status: DC | PRN

## 2020-03-23 MED ORDER — ASPIRIN EC 81 MG PO TBEC
81.0000 mg | DELAYED_RELEASE_TABLET | Freq: Every day | ORAL | Status: DC
  Administered 2020-03-24 – 2020-03-25 (×2): 81 mg via ORAL
  Filled 2020-03-23 (×2): qty 1

## 2020-03-23 MED ORDER — ONDANSETRON HCL 4 MG/2ML IJ SOLN: 4.0000 mg | Freq: Four times a day (QID) | INTRAMUSCULAR | Status: DC | PRN

## 2020-03-23 MED ORDER — SODIUM CHLORIDE 0.9% FLUSH: 3.0000 mL | INTRAVENOUS | Status: DC | PRN

## 2020-03-23 MED ORDER — FENTANYL CITRATE (PF) 100 MCG/2ML IJ SOLN
INTRAMUSCULAR | Status: DC | PRN
  Administered 2020-03-23 (×2): 25 ug via INTRAVENOUS

## 2020-03-23 MED ORDER — HEPARIN (PORCINE) IN NACL 1000-0.9 UT/500ML-% IV SOLN
INTRAVENOUS | Status: DC | PRN
  Administered 2020-03-23 (×2): 500 mL

## 2020-03-23 MED ORDER — LABETALOL HCL 5 MG/ML IV SOLN: 10.0000 mg | INTRAVENOUS | Status: AC | PRN

## 2020-03-23 MED ORDER — SODIUM CHLORIDE 0.9 % WEIGHT BASED INFUSION
3.0000 mL/kg/h | INTRAVENOUS | Status: DC
  Administered 2020-03-23: 3 mL/kg/h via INTRAVENOUS

## 2020-03-23 MED ORDER — FENTANYL CITRATE (PF) 100 MCG/2ML IJ SOLN
INTRAMUSCULAR | Status: AC
  Filled 2020-03-23: qty 2

## 2020-03-23 MED ORDER — IOHEXOL 350 MG/ML SOLN
INTRAVENOUS | Status: DC | PRN
  Administered 2020-03-23: 90 mL via INTRA_ARTERIAL

## 2020-03-23 MED ORDER — SODIUM CHLORIDE 0.9 % IV SOLN: INTRAVENOUS | Status: AC

## 2020-03-23 MED ORDER — ATORVASTATIN CALCIUM 40 MG PO TABS
40.0000 mg | ORAL_TABLET | Freq: Every day | ORAL | Status: DC
  Administered 2020-03-23 – 2020-03-25 (×3): 40 mg via ORAL
  Filled 2020-03-23 (×3): qty 1

## 2020-03-23 MED ORDER — LOSARTAN POTASSIUM 25 MG PO TABS
25.0000 mg | ORAL_TABLET | Freq: Every day | ORAL | Status: DC
  Administered 2020-03-23 – 2020-03-25 (×3): 25 mg via ORAL
  Filled 2020-03-23 (×3): qty 1

## 2020-03-23 MED ORDER — MIDAZOLAM HCL 2 MG/2ML IJ SOLN
INTRAMUSCULAR | Status: DC | PRN
  Administered 2020-03-23: 2 mg via INTRAVENOUS
  Administered 2020-03-23: 1 mg via INTRAVENOUS

## 2020-03-23 MED ORDER — ALPRAZOLAM 0.25 MG PO TABS
0.2500 mg | ORAL_TABLET | Freq: Three times a day (TID) | ORAL | Status: DC | PRN
  Administered 2020-03-23 – 2020-03-25 (×3): 0.25 mg via ORAL
  Filled 2020-03-23 (×3): qty 1

## 2020-03-23 MED ORDER — SODIUM CHLORIDE 0.9% FLUSH
3.0000 mL | Freq: Two times a day (BID) | INTRAVENOUS | Status: DC
  Administered 2020-03-24 – 2020-03-25 (×4): 3 mL via INTRAVENOUS

## 2020-03-23 MED ORDER — NICOTINE 21 MG/24HR TD PT24: 21.0000 mg | MEDICATED_PATCH | Freq: Every day | TRANSDERMAL | Status: DC

## 2020-03-23 MED ORDER — MIDAZOLAM HCL 2 MG/2ML IJ SOLN
INTRAMUSCULAR | Status: AC
  Filled 2020-03-23: qty 2

## 2020-03-23 MED ORDER — SODIUM CHLORIDE 0.9 % WEIGHT BASED INFUSION: 1.0000 mL/kg/h | INTRAVENOUS | Status: DC

## 2020-03-23 MED ORDER — NITROGLYCERIN 0.4 MG SL SUBL: 0.4000 mg | SUBLINGUAL_TABLET | SUBLINGUAL | Status: DC | PRN

## 2020-03-23 MED ORDER — LIDOCAINE HCL (PF) 1 % IJ SOLN
INTRAMUSCULAR | Status: AC
  Filled 2020-03-23: qty 30

## 2020-03-23 MED ORDER — VERAPAMIL HCL 2.5 MG/ML IV SOLN
INTRAVENOUS | Status: AC
  Filled 2020-03-23: qty 2

## 2020-03-23 MED ORDER — ACETAMINOPHEN 325 MG PO TABS: 650.0000 mg | ORAL_TABLET | ORAL | Status: DC | PRN

## 2020-03-23 MED ORDER — HEPARIN SODIUM (PORCINE) 1000 UNIT/ML IJ SOLN
INTRAMUSCULAR | Status: DC | PRN
  Administered 2020-03-23: 6000 [IU] via INTRAVENOUS
  Administered 2020-03-23: 5000 [IU] via INTRAVENOUS
  Administered 2020-03-23: 3000 [IU] via INTRAVENOUS

## 2020-03-23 MED ORDER — VERAPAMIL HCL 2.5 MG/ML IV SOLN
INTRAVENOUS | Status: DC | PRN
  Administered 2020-03-23: 10 mL via INTRA_ARTERIAL

## 2020-03-23 MED ORDER — NICOTINE 21 MG/24HR TD PT24
21.0000 mg | MEDICATED_PATCH | Freq: Every day | TRANSDERMAL | Status: DC
  Administered 2020-03-23 – 2020-03-30 (×8): 21 mg via TRANSDERMAL
  Filled 2020-03-23 (×8): qty 1

## 2020-03-23 SURGICAL SUPPLY — 12 items
CATH 5FR JL3.5 JR4 ANG PIG MP (CATHETERS) ×2 IMPLANT
CATH LAUNCHER 6FR EBU3.5 (CATHETERS) ×2 IMPLANT
DEVICE RAD COMP TR BAND LRG (VASCULAR PRODUCTS) ×2 IMPLANT
GLIDESHEATH SLEND SS 6F .021 (SHEATH) ×2 IMPLANT
GUIDEWIRE INQWIRE 1.5J.035X260 (WIRE) ×1 IMPLANT
GUIDEWIRE PRESSURE COMET II (WIRE) ×2 IMPLANT
INQWIRE 1.5J .035X260CM (WIRE) ×2
KIT ENCORE 26 ADVANTAGE (KITS) ×2 IMPLANT
KIT HEART LEFT (KITS) ×2 IMPLANT
PACK CARDIAC CATHETERIZATION (CUSTOM PROCEDURE TRAY) ×2 IMPLANT
TRANSDUCER W/STOPCOCK (MISCELLANEOUS) ×2 IMPLANT
TUBING CIL FLEX 10 FLL-RA (TUBING) ×2 IMPLANT

## 2020-03-23 NOTE — Consult Note (Signed)
Reason for Consult:3 vessel CAD Referring Physician: Dr. Fortino Sic  Marc Johnston is an 44 y.o. male.  HPI: 44 yo man with multiple CRf including hypertension, tobacco abuse and family history of CAD presents with accelerating angina. First had CP with exertion about a year ago. Would only occur with heavy exertion. Over the past month has had increasing frequency and severity with progressively less exertion. Coronary CT showed significant disease in the LAD, circumflex and RCA. Today he underwent cath which revealed severe 3 vessel CAD, EF was 55-60%  Currently pain free.  Past Medical History:  Diagnosis Date  . Allergy   . Complication of anesthesia    wake up aggressive if confused about where he is  . Depression   . Hypertension    HCTZ 2005-2008.  [name] OSA (obstructive sleep apnea) 05/05/2006   Mild.  No CPAP warranted.  . Periapical granuloma    left mandible lateral per CT 07/2014    Past Surgical History:  Procedure Laterality Date  . CERVICAL DISC ARTHROPLASTY N/A 03/21/2019   Procedure: CERVICAL ANTERIOR DISC ARTHROPLASTY C6-7;  Surgeon: Lucy Chris, MD;  Location: [facility] ORS;  Service: Neurosurgery;  Laterality: N/A;  . FINGER SURGERY Right    as a child  reattached 2nd finger   . NASAL SINUS SURGERY      Family History  Problem Relation Age of Onset  . Diabetes Father   . Heart disease Father   . Heart disease Mother     Social History:  reports that he has been smoking. He has never used smokeless tobacco. He reports current alcohol use. He reports that he does not use drugs.  Allergies:  Allergies  Allergen Reactions  . Morphine Other (See Comments)    Severe agitation  . Guaifenesin Other (See Comments)    unknown    Medications:  Scheduled: . sodium chloride flush  3 mL Intravenous Q12H    Results for orders placed or performed during the hospital encounter of 03/23/20 (from the past 48 hour(s))  SARS Coronavirus 2 by RT PCR (hospital order,  performed in [facility] hospital lab) Nasopharyngeal Nasopharyngeal Swab     Status: None   Collection Time: 03/23/20 12:59 PM   Specimen: Nasopharyngeal Swab  Result Value Ref Range   SARS Coronavirus 2 NEGATIVE NEGATIVE    Comment: (NOTE) SARS-CoV-2 target nucleic acids are NOT DETECTED.  The SARS-CoV-2 RNA is generally detectable in upper and lower respiratory specimens during the acute phase of infection. The lowest concentration of SARS-CoV-2 viral copies this assay can detect is 250 copies / mL. A negative result does not preclude SARS-CoV-2 infection and should not be used as the sole basis for treatment or other patient management decisions.  A negative result may occur with improper specimen collection / handling, submission of specimen other than nasopharyngeal swab, presence of viral mutation(s) within the areas targeted by this assay, and inadequate number of viral copies (<250 copies / mL). A negative result must be combined with clinical observations, patient history, and epidemiological information.  Fact Sheet for Patients:   BoilerBrush.com.cy  Fact Sheet for Healthcare Providers: https://pope.com/  This test is not yet approved or  cleared by the [place] FDA and has been authorized for detection and/or diagnosis of SARS-CoV-2 by FDA under an Emergency Use Authorization (EUA).  This EUA will remain in effect (meaning this test can be used) for the duration of the COVID-19 declaration under Section 564(b)(1) of the Act, 21 U.S.C. section  360bbb-3(b)(1), unless the authorization is terminated or revoked sooner.  Performed at [facility], [address] [place], [place] [address]     CARDIAC CATHETERIZATION  Result Date: 03/23/2020 Conclusions: 1. Severe, multivessel coronary artery disease, as detailed below. 2. Normal left ventricular systolic function with mildly elevated filling pressure.  Recommendations: 1. Admit for cardiac surgery consultation for consideration of CABG, given multivessel CAD including proximal LAD/D1 involvement.  LAD and RCA could be treated percutaneously, with medical management of occluded ramus intermedius and non-critical distal LCx disease. 2. Aggressive secondary prevention. 3. Obtain echocardiogram. [name] End, MD [facility]   CT CORONARY MORPH W/CTA COR W/SCORE W/CA W/CM &/OR WO/CM  Addendum Date: 03/23/2020   ADDENDUM REPORT: 03/23/2020 11:57 ADDENDUM: I have reviewed the CT images again after FFR - there does appear to be a significant non-calcified stenosis of the mid to distal LCx which is likely flow-limiting. Additionally, the proximal RCA appeared hazy and possibly artifactually narrowed, however, further review suggests there may be a small flow channel suggestive of severe napkin-ring type stenosis. The patient was notified of this and the abnormal FFR results and has been scheduled for cardiac cath today. Electronically Signed   By: Chrystie NoseKenneth C Hilty M.D.   On: 03/23/2020 11:57   Addendum Date: 03/22/2020   ADDENDUM REPORT: 03/22/2020 17:04 HISTORY: 44 yo male with chest pain, nonspecific EXAM: Cardiac/Coronary CTA TECHNIQUE: The patient was scanned on a [employer] Force scanner. PROTOCOL: A 120 kV prospective scan was triggered in the descending thoracic aorta at 111 HU's. Axial non-contrast 3 mm slices were carried out through the heart. The data set was analyzed on a dedicated work station and scored using the Agatson method. Gantry rotation speed was 250 msecs and collimation was .6 mm. Beta blockade and 0.8 mg of sl NTG was given. The 3D data set was reconstructed in 5% intervals of the 67-82 % of the R-R cycle. Diastolic phases were analyzed on a dedicated work station using MPR, MIP and VRT modes. The patient received 80mL OMNIPAQUE IOHEXOL 350 MG/ML SOLN of contrast. FINDINGS: Quality: Very good, HR 58 Coronary calcium score: The patient's  coronary artery calcium score is 79, which places the patient in the 93rd percentile. Coronary arteries: Normal coronary origins.  Appears co-dominant. Right Coronary Artery: Hazy proximal non-calcified stenosis - mild to moderate (CADRADS2). The distal vessel is poorly visualized. Left Main Coronary Artery: Minimal 1-24% distal non-calcified stenosis (CADRADS1). Bifurcates into the LAD and LCx arteries. Left Anterior Descending Coronary Artery: Moderate size anterior vessel with a large diagonal. Moderate to severe mixed proximal stenosis (CADRADS3). There is moderate to severe mixed disease of the proximal D1 as well. Left Circumflex Artery: Large AV groove vessel with minimal 1-24% mixed proximal stenosis (CADRADS1). Moderate distal L-PDA branch without disease. Aorta: Normal size, 34 mm at the mid ascending aorta (level of the PA bifurcation) measured double oblique. No calcifications. No dissection. Aortic Valve: Trileaflet.  No calcifications. Other findings: Normal pulmonary vein drainage into the left atrium. Normal left atrial appendage without a thrombus. Dilated main pulmonary artery at 31 mm, suggestive of pulmonary hypertension. IMPRESSION: 1. Moderate to severe proximal mixed LAD stenosis, CADRADS = 3. CT FFR will be submitted. 2. Coronary calcium score of 79. This was 93rd percentile for age and sex matched control. 3. Normal coronary origin with co-dominance. 4. Dilated main pulmonary artery at 31 mm, suggestive of pulmonary hypertension. Electronically Signed   By: Chrystie NoseKenneth C Hilty M.D.   On: 03/22/2020 17:04  Result Date: 03/23/2020 EXAM: OVER-READ INTERPRETATION  CT CHEST The following report is an over-read performed by radiologist Dr. Charlett Nose of [facility] on 03/22/2020. This over-read does not include interpretation of cardiac or coronary anatomy or pathology. The coronary CTA interpretation by the cardiologist is attached. COMPARISON:  None. FINDINGS: Vascular: Heart is  normal size.  Aorta normal caliber. Mediastinum/Nodes: No adenopathy. Lungs/Pleura: No confluent opacities or effusions. Upper Abdomen: Imaging into the upper abdomen demonstrates no acute findings. Musculoskeletal: Chest wall soft tissues are unremarkable. No acute bony abnormality. IMPRESSION: No acute or significant extracardiac abnormality. Electronically Signed: By: Charlett Nose M.D. On: 03/22/2020 16:39   CT CORONARY FRACTIONAL FLOW RESERVE DATA PREP  Result Date: 03/23/2020 EXAM: CT FFR ANALYSIS CLINICAL DATA:  Chest pain, nonspecific FINDINGS: FFRct analysis was performed on the original cardiac CT angiogram dataset. Diagrammatic representation of the FFRct analysis is provided in a separate PDF document in PACS. This dictation was created using the PDF document and an interactive 3D model of the results. 3D model is not available in the EMR/PACS. Normal FFR range is >0.80. 1. Left Main: No significant stenosis. FFR = 1.00 2. LAD: Significant stenosis. Proximal FFR = 0.96, Mid FFR = 0.59, Distal FFR = <0.50 3. Large diagonal: Significant stenosis. Proximal FFR = 0.80, Proximal to mid FFR = 0.65 4. LCX: Significant stenosis. Proximal FFR = 0.95, Distal FFR = 0.63 5. RCA: Significant stenosis. Proximal FFR = 1.00, Proximal to mid FFR = 0.53, Distal FFR = <0.50 IMPRESSION: 1. CT FFR demonstrates severe, discrete multi-vessel flow-limiting stenoses of the proximal to mid RCA, LAD, large diagonal and LCX arteries. 2.  Urgent cardiac catheterization is recommended. Electronically Signed   By: Chrystie Nose M.D.   On: 03/23/2020 07:51    Review of Systems  Constitutional: Negative for activity change, fever and unexpected weight change.  HENT: Negative for trouble swallowing and voice change.   Respiratory: Positive for apnea and shortness of breath.   Cardiovascular: Positive for chest pain. Negative for leg swelling.  Gastrointestinal: Negative for abdominal distention and abdominal pain.   Genitourinary: Negative for difficulty urinating and dysuria.  Musculoskeletal: Negative for arthralgias and myalgias.  Neurological: Negative for syncope and weakness.  Psychiatric/Behavioral: Positive for dysphoric mood.  All other systems reviewed and are negative.  Blood pressure 130/71, pulse 66, temperature 98 F (36.7 C), temperature source Oral, resp. rate 15, height 5\' 10"  (1.778 m), weight 113.4 kg, SpO2 99 %. Physical Exam Vitals reviewed.  Constitutional:      General: He is not in acute distress.    Appearance: He is obese.  HENT:     Head: Normocephalic and atraumatic.  Eyes:     General: No scleral icterus.    Extraocular Movements: Extraocular movements intact.  Neck:     Vascular: No carotid bruit.  Cardiovascular:     Rate and Rhythm: Normal rate and regular rhythm.     Pulses: Normal pulses.     Heart sounds: Normal heart sounds. No murmur heard.  No friction rub. No gallop.      Comments: Sluggish refill on left with Allen's test Pulmonary:     Effort: Pulmonary effort is normal. No respiratory distress.     Breath sounds: Normal breath sounds. No wheezing or rales.  Abdominal:     General: There is no distension.     Palpations: Abdomen is soft.     Tenderness: There is no abdominal tenderness.  Musculoskeletal:  Cervical back: Neck supple.  Skin:    General: Skin is warm and dry.  Neurological:     General: No focal deficit present.     Mental Status: He is alert and oriented to person, place, and time.     Cranial Nerves: No cranial nerve deficit.     Motor: No weakness.     Gait: Gait normal.   Cardiac cath Conclusions: 1. Severe, multivessel coronary artery disease, as detailed below. 2. Normal left ventricular systolic function with mildly elevated filling pressure.  Recommendations: 1. Admit for cardiac surgery consultation for consideration of CABG, given multivessel CAD including proximal LAD/D1 involvement.  LAD and RCA could be  treated percutaneously, with medical management of occluded ramus intermedius and non-critical distal LCx disease. 2. Aggressive secondary prevention. 3. Obtain echocardiogram.  Yvonne Kendall, MD [facility] I personally reviewed the cath images and concur with the findings noted above   Assessment/Plan: 44 yo man with multiple CRF who presents with accelerating angina. At cath has severe 3 vessel CAD with preserved LV function. CABG indicated for survival benefit and relief of symptoms. PCI also possible but more CABG more likely to provide long term benefit.  I discussed the general nature of the procedure, including the need for general anesthesia, the incisions to be used, the use of cardiopulmonary bypass, and the use of drainage tubes postop with Mr. Kant.  We discussed the expected hospital stay, overall recovery and short and long term outcomes. I informed him of the indications, risks, benefits and alternatives.  He understands the risks include, but are not limited to death, stroke, MI, DVT/PE, bleeding, possible need for transfusion, infections, cardiac arrhythmias, as well as other organ system dysfunction including respiratory, renal, or GI complications.  Allen's test shows sluggish flow but will await Vascular lab assessment before ruling radial out.  He accepts the risks and agrees to proceed.  Plan OR Monday 11/22  [name] 03/23/2020, 5:07 PM

## 2020-03-23 NOTE — H&P (View-Only) (Signed)
Reason for Consult:3 vessel CAD Referring Physician: Dr. Fortino Sic  Marc Johnston is an 44 y.o. male.  HPI: 44 yo man with multiple CRf including hypertension, tobacco abuse and family history of CAD presents with accelerating angina. First had CP with exertion about a year ago. Would only occur with heavy exertion. Over the past month has had increasing frequency and severity with progressively less exertion. Coronary CT showed significant disease in the LAD, circumflex and RCA. Today he underwent cath which revealed severe 3 vessel CAD, EF was 55-60%  Currently pain free.  Past Medical History:  Diagnosis Date  . Allergy   . Complication of anesthesia    wake up aggressive if confused about where he is  . Depression   . Hypertension    HCTZ 2005-2008.  [name] OSA (obstructive sleep apnea) 05/05/2006   Mild.  No CPAP warranted.  . Periapical granuloma    left mandible lateral per CT 07/2014    Past Surgical History:  Procedure Laterality Date  . CERVICAL DISC ARTHROPLASTY N/A 03/21/2019   Procedure: CERVICAL ANTERIOR DISC ARTHROPLASTY C6-7;  Surgeon: Lucy Chris, MD;  Location: [facility] ORS;  Service: Neurosurgery;  Laterality: N/A;  . FINGER SURGERY Right    as a child  reattached 2nd finger   . NASAL SINUS SURGERY      Family History  Problem Relation Age of Onset  . Diabetes Father   . Heart disease Father   . Heart disease Mother     Social History:  reports that he has been smoking. He has never used smokeless tobacco. He reports current alcohol use. He reports that he does not use drugs.  Allergies:  Allergies  Allergen Reactions  . Morphine Other (See Comments)    Severe agitation  . Guaifenesin Other (See Comments)    unknown    Medications:  Scheduled: . sodium chloride flush  3 mL Intravenous Q12H    Results for orders placed or performed during the hospital encounter of 03/23/20 (from the past 48 hour(s))  SARS Coronavirus 2 by RT PCR (hospital order,  performed in [facility] hospital lab) Nasopharyngeal Nasopharyngeal Swab     Status: None   Collection Time: 03/23/20 12:59 PM   Specimen: Nasopharyngeal Swab  Result Value Ref Range   SARS Coronavirus 2 NEGATIVE NEGATIVE    Comment: (NOTE) SARS-CoV-2 target nucleic acids are NOT DETECTED.  The SARS-CoV-2 RNA is generally detectable in upper and lower respiratory specimens during the acute phase of infection. The lowest concentration of SARS-CoV-2 viral copies this assay can detect is 250 copies / mL. A negative result does not preclude SARS-CoV-2 infection and should not be used as the sole basis for treatment or other patient management decisions.  A negative result may occur with improper specimen collection / handling, submission of specimen other than nasopharyngeal swab, presence of viral mutation(s) within the areas targeted by this assay, and inadequate number of viral copies (<250 copies / mL). A negative result must be combined with clinical observations, patient history, and epidemiological information.  Fact Sheet for Patients:   BoilerBrush.com.cy  Fact Sheet for Healthcare Providers: https://pope.com/  This test is not yet approved or  cleared by the [place] FDA and has been authorized for detection and/or diagnosis of SARS-CoV-2 by FDA under an Emergency Use Authorization (EUA).  This EUA will remain in effect (meaning this test can be used) for the duration of the COVID-19 declaration under Section 564(b)(1) of the Act, 21 U.S.C. section  360bbb-3(b)(1), unless the authorization is terminated or revoked sooner.  Performed at [facility], [address] [place], [place] [address]     CARDIAC CATHETERIZATION  Result Date: 03/23/2020 Conclusions: 1. Severe, multivessel coronary artery disease, as detailed below. 2. Normal left ventricular systolic function with mildly elevated filling pressure.  Recommendations: 1. Admit for cardiac surgery consultation for consideration of CABG, given multivessel CAD including proximal LAD/D1 involvement.  LAD and RCA could be treated percutaneously, with medical management of occluded ramus intermedius and non-critical distal LCx disease. 2. Aggressive secondary prevention. 3. Obtain echocardiogram. [name], MD [facility]   CT CORONARY MORPH W/CTA COR W/SCORE W/CA W/CM &/OR WO/CM  Addendum Date: 03/23/2020   ADDENDUM REPORT: 03/23/2020 11:57 ADDENDUM: I have reviewed the CT images again after FFR - there does appear to be a significant non-calcified stenosis of the mid to distal LCx which is likely flow-limiting. Additionally, the proximal RCA appeared hazy and possibly artifactually narrowed, however, further review suggests there may be a small flow channel suggestive of severe napkin-ring type stenosis. The patient was notified of this and the abnormal FFR results and has been scheduled for cardiac cath today. Electronically Signed   By: Kenneth C Hilty M.D.   On: 03/23/2020 11:57   Addendum Date: 03/22/2020   ADDENDUM REPORT: 03/22/2020 17:04 HISTORY: 44 yo male with chest pain, nonspecific EXAM: Cardiac/Coronary CTA TECHNIQUE: The patient was scanned on a Siemens Force scanner. PROTOCOL: A 120 kV prospective scan was triggered in the descending thoracic aorta at 111 HU's. Axial non-contrast 3 mm slices were carried out through the heart. The data set was analyzed on a dedicated work station and scored using the Agatson method. Gantry rotation speed was 250 msecs and collimation was .6 mm. Beta blockade and 0.8 mg of sl NTG was given. The 3D data set was reconstructed in 5% intervals of the 67-82 % of the R-R cycle. Diastolic phases were analyzed on a dedicated work station using MPR, MIP and VRT modes. The patient received 80mL OMNIPAQUE IOHEXOL 350 MG/ML SOLN of contrast. FINDINGS: Quality: Very good, HR 58 Coronary calcium score: The patient's  coronary artery calcium score is 79, which places the patient in the 93rd percentile. Coronary arteries: Normal coronary origins.  Appears co-dominant. Right Coronary Artery: Hazy proximal non-calcified stenosis - mild to moderate (CADRADS2). The distal vessel is poorly visualized. Left Main Coronary Artery: Minimal 1-24% distal non-calcified stenosis (CADRADS1). Bifurcates into the LAD and LCx arteries. Left Anterior Descending Coronary Artery: Moderate size anterior vessel with a large diagonal. Moderate to severe mixed proximal stenosis (CADRADS3). There is moderate to severe mixed disease of the proximal D1 as well. Left Circumflex Artery: Large AV groove vessel with minimal 1-24% mixed proximal stenosis (CADRADS1). Moderate distal L-PDA branch without disease. Aorta: Normal size, 34 mm at the mid ascending aorta (level of the PA bifurcation) measured double oblique. No calcifications. No dissection. Aortic Valve: Trileaflet.  No calcifications. Other findings: Normal pulmonary vein drainage into the left atrium. Normal left atrial appendage without a thrombus. Dilated main pulmonary artery at 31 mm, suggestive of pulmonary hypertension. IMPRESSION: 1. Moderate to severe proximal mixed LAD stenosis, CADRADS = 3. CT FFR will be submitted. 2. Coronary calcium score of 79. This was 93rd percentile for age and sex matched control. 3. Normal coronary origin with co-dominance. 4. Dilated main pulmonary artery at 31 mm, suggestive of pulmonary hypertension. Electronically Signed   By: Kenneth C Hilty M.D.   On: 03/22/2020 17:04     Result Date: 03/23/2020 EXAM: OVER-READ INTERPRETATION  CT CHEST The following report is an over-read performed by radiologist Dr. Charlett Nose of [facility] on 03/22/2020. This over-read does not include interpretation of cardiac or coronary anatomy or pathology. The coronary CTA interpretation by the cardiologist is attached. COMPARISON:  None. FINDINGS: Vascular: Heart is  normal size.  Aorta normal caliber. Mediastinum/Nodes: No adenopathy. Lungs/Pleura: No confluent opacities or effusions. Upper Abdomen: Imaging into the upper abdomen demonstrates no acute findings. Musculoskeletal: Chest wall soft tissues are unremarkable. No acute bony abnormality. IMPRESSION: No acute or significant extracardiac abnormality. Electronically Signed: By: Charlett Nose M.D. On: 03/22/2020 16:39   CT CORONARY FRACTIONAL FLOW RESERVE DATA PREP  Result Date: 03/23/2020 EXAM: CT FFR ANALYSIS CLINICAL DATA:  Chest pain, nonspecific FINDINGS: FFRct analysis was performed on the original cardiac CT angiogram dataset. Diagrammatic representation of the FFRct analysis is provided in a separate PDF document in PACS. This dictation was created using the PDF document and an interactive 3D model of the results. 3D model is not available in the EMR/PACS. Normal FFR range is >0.80. 1. Left Main: No significant stenosis. FFR = 1.00 2. LAD: Significant stenosis. Proximal FFR = 0.96, Mid FFR = 0.59, Distal FFR = <0.50 3. Large diagonal: Significant stenosis. Proximal FFR = 0.80, Proximal to mid FFR = 0.65 4. LCX: Significant stenosis. Proximal FFR = 0.95, Distal FFR = 0.63 5. RCA: Significant stenosis. Proximal FFR = 1.00, Proximal to mid FFR = 0.53, Distal FFR = <0.50 IMPRESSION: 1. CT FFR demonstrates severe, discrete multi-vessel flow-limiting stenoses of the proximal to mid RCA, LAD, large diagonal and LCX arteries. 2.  Urgent cardiac catheterization is recommended. Electronically Signed   By: Chrystie Nose M.D.   On: 03/23/2020 07:51    Review of Systems  Constitutional: Negative for activity change, fever and unexpected weight change.  HENT: Negative for trouble swallowing and voice change.   Respiratory: Positive for apnea and shortness of breath.   Cardiovascular: Positive for chest pain. Negative for leg swelling.  Gastrointestinal: Negative for abdominal distention and abdominal pain.   Genitourinary: Negative for difficulty urinating and dysuria.  Musculoskeletal: Negative for arthralgias and myalgias.  Neurological: Negative for syncope and weakness.  Psychiatric/Behavioral: Positive for dysphoric mood.  All other systems reviewed and are negative.  Blood pressure 130/71, pulse 66, temperature 98 F (36.7 C), temperature source Oral, resp. rate 15, height 5\' 10"  (1.778 m), weight 113.4 kg, SpO2 99 %. Physical Exam Vitals reviewed.  Constitutional:      General: He is not in acute distress.    Appearance: He is obese.  HENT:     Head: Normocephalic and atraumatic.  Eyes:     General: No scleral icterus.    Extraocular Movements: Extraocular movements intact.  Neck:     Vascular: No carotid bruit.  Cardiovascular:     Rate and Rhythm: Normal rate and regular rhythm.     Pulses: Normal pulses.     Heart sounds: Normal heart sounds. No murmur heard.  No friction rub. No gallop.      Comments: Sluggish refill on left with Allen's test Pulmonary:     Effort: Pulmonary effort is normal. No respiratory distress.     Breath sounds: Normal breath sounds. No wheezing or rales.  Abdominal:     General: There is no distension.     Palpations: Abdomen is soft.     Tenderness: There is no abdominal tenderness.  Musculoskeletal:  Cervical back: Neck supple.  Skin:    General: Skin is warm and dry.  Neurological:     General: No focal deficit present.     Mental Status: He is alert and oriented to person, place, and time.     Cranial Nerves: No cranial nerve deficit.     Motor: No weakness.     Gait: Gait normal.   Cardiac cath Conclusions: 1. Severe, multivessel coronary artery disease, as detailed below. 2. Normal left ventricular systolic function with mildly elevated filling pressure.  Recommendations: 1. Admit for cardiac surgery consultation for consideration of CABG, given multivessel CAD including proximal LAD/D1 involvement.  LAD and RCA could be  treated percutaneously, with medical management of occluded ramus intermedius and non-critical distal LCx disease. 2. Aggressive secondary prevention. 3. Obtain echocardiogram.  Yvonne Kendall, MD [facility] I personally reviewed the cath images and concur with the findings noted above   Assessment/Plan: 44 yo man with multiple CRF who presents with accelerating angina. At cath has severe 3 vessel CAD with preserved LV function. CABG indicated for survival benefit and relief of symptoms. PCI also possible but more CABG more likely to provide long term benefit.  I discussed the general nature of the procedure, including the need for general anesthesia, the incisions to be used, the use of cardiopulmonary bypass, and the use of drainage tubes postop with Mr. Kant.  We discussed the expected hospital stay, overall recovery and short and long term outcomes. I informed him of the indications, risks, benefits and alternatives.  He understands the risks include, but are not limited to death, stroke, MI, DVT/PE, bleeding, possible need for transfusion, infections, cardiac arrhythmias, as well as other organ system dysfunction including respiratory, renal, or GI complications.  Allen's test shows sluggish flow but will await Vascular lab assessment before ruling radial out.  He accepts the risks and agrees to proceed.  Plan OR Monday 11/22  [name] 03/23/2020, 5:07 PM

## 2020-03-23 NOTE — Interval H&P Note (Signed)
History and Physical Interval Note:  03/23/2020 2:35 PM  Marc Johnston  has presented today for surgery, with the diagnosis of unstable angina and abnormal cardiac CTA.  The various methods of treatment have been discussed with the patient and family. After consideration of risks, benefits and other options for treatment, the patient has consented to  Procedure(s): LEFT HEART CATH AND CORONARY ANGIOGRAPHY (N/A) as a surgical intervention.  The patient's history has been reviewed, patient examined, no change in status, stable for surgery.  I have reviewed the patient's chart and labs.  Questions were answered to the patient's satisfaction.    Cath Lab Visit (complete for each Cath Lab visit)  Clinical Evaluation Leading to the Procedure:   ACS: No.  Non-ACS:    Anginal Classification: CCS III  Anti-ischemic medical therapy: No Therapy  Non-Invasive Test Results: High-risk stress test findings: cardiac mortality >3%/year (CTFFR positive 3-vessel coronary artery disease)  Prior CABG: No previous CABG  [name]

## 2020-03-23 NOTE — Telephone Encounter (Signed)
   I called Marc Johnston this morning at 9:45 am personally with the results of his CT FFR study.  This demonstrated concern for multivessel coronary artery disease which is flow-limiting.  I explained the results in great detail to him today and I am recommending an urgent heart catheterization.  He told me over the last couple days he has had more chest discomfort, even at rest which is now more concerning for unstable angina.  I did discuss the risk, benefits and alternatives of cardiac catheterization including the procedure in great detail and he had no questions.  He then provided informed consent for the procedure.  He said he was supposed to have a back injection of steroid today at 3:15 which he will cancel.  He said that he has not eaten this morning and I advised him to remain n.p.o. as we will try to schedule him for heart catheterization this afternoon.  He did have recent labs but would need a Covid test on site.  Plan follow-up with me as scheduled.  Chrystie Nose, MD, [facility], FACP  [place]  [facility]  Medical Director of the [facility]  Card[facility]ovascular Risk Reduction Clinic Diplomate of the American Board of Clinical Lipidology Attending Cardiologist  Direct Dial: [phone]  Fax: [phone]  Website:  [email or web address]

## 2020-03-23 NOTE — Telephone Encounter (Signed)
Erroneous encounter

## 2020-03-23 NOTE — Telephone Encounter (Signed)
Coordinated with Victorino Dike with cath lab. Patient will have cardiac catheterization procedure today - COVID swab on arrival, tentative procedure time 1pm. She communicated all information to the patient.

## 2020-03-24 ENCOUNTER — Inpatient Hospital Stay (HOSPITAL_COMMUNITY): Payer: BLUE CROSS/BLUE SHIELD

## 2020-03-24 DIAGNOSIS — I2510 Atherosclerotic heart disease of native coronary artery without angina pectoris: Secondary | ICD-10-CM | POA: Diagnosis not present

## 2020-03-24 DIAGNOSIS — I200 Unstable angina: Secondary | ICD-10-CM

## 2020-03-24 LAB — CBC
HCT: 46.8 % (ref 39.0–52.0)
Hemoglobin: 15.4 g/dL (ref 13.0–17.0)
MCH: 31 pg (ref 26.0–34.0)
MCHC: 32.9 g/dL (ref 30.0–36.0)
MCV: 94.4 fL (ref 80.0–100.0)
Platelets: 140 10*3/uL — ABNORMAL LOW (ref 150–400)
RBC: 4.96 MIL/uL (ref 4.22–5.81)
RDW: 12.3 % (ref 11.5–15.5)
WBC: 8 10*3/uL (ref 4.0–10.5)
nRBC: 0 % (ref 0.0–0.2)

## 2020-03-24 LAB — HEMOGLOBIN A1C
Hgb A1c MFr Bld: 5.6 % (ref 4.8–5.6)
Hgb A1c MFr Bld: 5.6 % (ref 4.8–5.6)
Mean Plasma Glucose: 114.02 mg/dL
Mean Plasma Glucose: 114.02 mg/dL

## 2020-03-24 LAB — COMPREHENSIVE METABOLIC PANEL
ALT: 35 U/L (ref 0–44)
AST: 22 U/L (ref 15–41)
Albumin: 3.3 g/dL — ABNORMAL LOW (ref 3.5–5.0)
Alkaline Phosphatase: 87 U/L (ref 38–126)
Anion gap: 9 (ref 5–15)
BUN: 10 mg/dL (ref 6–20)
CO2: 26 mmol/L (ref 22–32)
Calcium: 9.2 mg/dL (ref 8.9–10.3)
Chloride: 105 mmol/L (ref 98–111)
Creatinine, Ser: 0.84 mg/dL (ref 0.61–1.24)
GFR, Estimated: >60 mL/min (ref >60)
Glucose, Bld: 102 mg/dL — ABNORMAL HIGH (ref 70–99)
Potassium: 3.7 mmol/L (ref 3.5–5.1)
Sodium: 140 mmol/L (ref 135–145)
Total Bilirubin: 0.7 mg/dL (ref 0.3–1.2)
Total Protein: 6.3 g/dL — ABNORMAL LOW (ref 6.5–8.1)

## 2020-03-24 LAB — URINALYSIS, ROUTINE W REFLEX MICROSCOPIC
Bilirubin Urine: NEGATIVE
Glucose, UA: NEGATIVE mg/dL
Hgb urine dipstick: NEGATIVE
Ketones, ur: NEGATIVE mg/dL
Leukocytes,Ua: NEGATIVE
Nitrite: NEGATIVE
Protein, ur: NEGATIVE mg/dL
Specific Gravity, Urine: 1.006 (ref 1.005–1.030)
pH: 8 (ref 5.0–8.0)

## 2020-03-24 LAB — ECHOCARDIOGRAM COMPLETE
Area-P 1/2: 2.95 cm2
Height: 70 in
S' Lateral: 4.1 cm
Weight: 3981.33 oz

## 2020-03-24 LAB — TYPE AND SCREEN
ABO/RH(D): O NEG
Antibody Screen: NEGATIVE

## 2020-03-24 LAB — LIPID PANEL
Cholesterol: 118 mg/dL (ref 0–200)
HDL: 29 mg/dL — ABNORMAL LOW (ref >40)
LDL Cholesterol: 58 mg/dL (ref 0–99)
Total CHOL/HDL Ratio: 4.1 RATIO
Triglycerides: 155 mg/dL — ABNORMAL HIGH (ref <150)
VLDL: 31 mg/dL (ref 0–40)

## 2020-03-24 LAB — SURGICAL PCR SCREEN
MRSA, PCR: NEGATIVE
Staphylococcus aureus: POSITIVE — AB

## 2020-03-24 MED ORDER — CHLORHEXIDINE GLUCONATE CLOTH 2 % EX PADS
6.0000 | MEDICATED_PAD | Freq: Every day | CUTANEOUS | Status: AC
  Administered 2020-03-26 – 2020-03-28 (×3): 6 via TOPICAL

## 2020-03-24 MED ORDER — MUPIROCIN 2 % EX OINT
1.0000 "application " | TOPICAL_OINTMENT | Freq: Two times a day (BID) | CUTANEOUS | Status: AC
  Administered 2020-03-24 – 2020-03-28 (×8): 1 via NASAL
  Filled 2020-03-24 (×3): qty 22

## 2020-03-24 NOTE — Procedures (Signed)
Patient does not want echo at this time 

## 2020-03-24 NOTE — Progress Notes (Signed)
[id number] Education completed with pt regarding staying in the tube and sternal precautions. Gave handout. Gave IS and pt demonstrated 2500 ml correctly. Discussed importance of walking and IS after surgery. Wrote down how to view pre op video. Gave OHS booklet and care guide. Pt lives with mother and stated she may be able to assist with care after discharge. Will follow up after surgery. Luetta Nutting RN BSN 03/24/2020 8:54 AM

## 2020-03-24 NOTE — Progress Notes (Signed)
  Echocardiogram 2D Echocardiogram has been performed.  Marc Johnston 03/24/2020, 4:50 PM

## 2020-03-24 NOTE — Progress Notes (Signed)
Progress Note  Patient Name: Marc Johnston Date of Encounter: 03/24/2020  [facility] Cardiologist: Chrystie Nose, MD   Subjective   No chest pain or dyspnea. He is going to attained his niece wedding via video this afternoon. He is not happy that he needs to cut his beard.   Inpatient Medications    Scheduled Meds: . aspirin EC  81 mg Oral Daily  . atorvastatin  40 mg Oral Daily  . enoxaparin (LOVENOX) injection  40 mg Subcutaneous Q24H  . losartan  25 mg Oral Daily  . metoprolol tartrate  12.5 mg Oral BID  . nicotine  21 mg Transdermal Daily  . sodium chloride flush  3 mL Intravenous Q12H   Continuous Infusions: . sodium chloride     PRN Meds: sodium chloride, acetaminophen, ALPRAZolam, nitroGLYCERIN, ondansetron (ZOFRAN) IV, sodium chloride flush   Vital Signs    Vitals:   03/23/20 2355 03/24/20 0435 03/24/20 0747 03/24/20 0917  BP: 140/88 125/83 (!) 130/99 132/78  Pulse: 65 88 76 71  Resp: 18 20 16 17   Temp: 98.7 F (37.1 C) 98.7 F (37.1 C) (!) 97.5 F (36.4 C) 97.9 F (36.6 C)  TempSrc: Oral Oral Axillary Oral  SpO2: 98% 99% 99% 99%  Weight:  112.9 kg    Height:        Intake/Output Summary (Last 24 hours) at 03/24/2020 1015 Last data filed at 03/24/2020 0932 Gross per 24 hour  Intake 563 ml  Output 200 ml  Net 363 ml   Last 3 Weights 03/24/2020 03/23/2020 03/16/2020  Weight (lbs) 248 lb 13.3 oz 250 lb 249 lb 12.8 oz  Weight (kg) 112.87 kg 113.399 kg 113.309 kg      Telemetry    NSR - Personally Reviewed  ECG    SR at rate of 60 with TWI in lateral leads  - Personally Reviewed  Physical Exam   GEN: No acute distress.   Neck: No JVD Cardiac: RRR, no murmurs, rubs, or gallops. Right radial cath site without hematoma  Respiratory: Clear to auscultation bilaterally. GI: Soft, nontender, non-distended  MS: No edema; No deformity. Neuro:  Nonfocal  Psych: Normal affect   Labs    Chemistry Recent Labs  Lab 03/24/20 0214  NA  140  K 3.7  CL 105  CO2 26  GLUCOSE 102*  BUN 10  CREATININE 0.84  CALCIUM 9.2  PROT 6.3*  ALBUMIN 3.3*  AST 22  ALT 35  ALKPHOS 87  BILITOT 0.7  GFRNONAA >60  ANIONGAP 9     Hematology Recent Labs  Lab 03/24/20 0214  WBC 8.0  RBC 4.96  HGB 15.4  HCT 46.8  MCV 94.4  MCH 31.0  MCHC 32.9  RDW 12.3  PLT 140*    Radiology    CARDIAC CATHETERIZATION  Result Date: 03/23/2020 Conclusions: 1. Severe, multivessel coronary artery disease, as detailed below. 2. Normal left ventricular systolic function with mildly elevated filling pressure. Recommendations: 1. Admit for cardiac surgery consultation for consideration of CABG, given multivessel CAD including proximal LAD/D1 involvement.  LAD and RCA could be treated percutaneously, with medical management of occluded ramus intermedius and non-critical distal LCx disease. 2. Aggressive secondary prevention. 3. Obtain echocardiogram. 03/25/2020, MD [facility]   CT CORONARY MORPH W/CTA COR W/SCORE W/CA W/CM &/OR WO/CM  Addendum Date: 03/23/2020   ADDENDUM REPORT: 03/23/2020 11:57 ADDENDUM: I have reviewed the CT images again after FFR - there does appear to be a significant non-calcified stenosis  of the mid to distal LCx which is likely flow-limiting. Additionally, the proximal RCA appeared hazy and possibly artifactually narrowed, however, further review suggests there may be a small flow channel suggestive of severe napkin-ring type stenosis. The patient was notified of this and the abnormal FFR results and has been scheduled for cardiac cath today. Electronically Signed   By: Chrystie Nose M.D.   On: 03/23/2020 11:57   Addendum Date: 03/22/2020   ADDENDUM REPORT: 03/22/2020 17:04 HISTORY: 44 yo male with chest pain, nonspecific EXAM: Cardiac/Coronary CTA TECHNIQUE: The patient was scanned on a [employer]. PROTOCOL: A 120 kV prospective scan was triggered in the descending thoracic aorta at 111 HU's. Axial  non-contrast 3 mm slices were carried out through the heart. The data set was analyzed on a dedicated work station and scored using the Agatson method. Gantry rotation speed was 250 msecs and collimation was .6 mm. Beta blockade and 0.8 mg of sl NTG was given. The 3D data set was reconstructed in 5% intervals of the 67-82 % of the R-R cycle. Diastolic phases were analyzed on a dedicated work station using MPR, MIP and VRT modes. The patient received 84mL OMNIPAQUE IOHEXOL 350 MG/ML SOLN of contrast. FINDINGS: Quality: Very good, HR 58 Coronary calcium score: The patient's coronary artery calcium score is 79, which places the patient in the 93rd percentile. Coronary arteries: Normal coronary origins.  Appears co-dominant. Right Coronary Artery: Hazy proximal non-calcified stenosis - mild to moderate (CADRADS2). The distal vessel is poorly visualized. Left Main Coronary Artery: Minimal 1-24% distal non-calcified stenosis (CADRADS1). Bifurcates into the LAD and LCx arteries. Left Anterior Descending Coronary Artery: Moderate size anterior vessel with a large diagonal. Moderate to severe mixed proximal stenosis (CADRADS3). There is moderate to severe mixed disease of the proximal D1 as well. Left Circumflex Artery: Large AV groove vessel with minimal 1-24% mixed proximal stenosis (CADRADS1). Moderate distal L-PDA branch without disease. Aorta: Normal size, 34 mm at the mid ascending aorta (level of the PA bifurcation) measured double oblique. No calcifications. No dissection. Aortic Valve: Trileaflet.  No calcifications. Other findings: Normal pulmonary vein drainage into the left atrium. Normal left atrial appendage without a thrombus. Dilated main pulmonary artery at 31 mm, suggestive of pulmonary hypertension. IMPRESSION: 1. Moderate to severe proximal mixed LAD stenosis, CADRADS = 3. CT FFR will be submitted. 2. Coronary calcium score of 79. This was 93rd percentile for age and sex matched control. 3. Normal  coronary origin with co-dominance. 4. Dilated main pulmonary artery at 31 mm, suggestive of pulmonary hypertension. Electronically Signed   By: Chrystie Nose M.D.   On: 03/22/2020 17:04   Result Date: 03/23/2020 EXAM: OVER-READ INTERPRETATION  CT CHEST The following report is an over-read performed by radiologist Dr. Charlett Nose of [facility] on 03/22/2020. This over-read does not include interpretation of cardiac or coronary anatomy or pathology. The coronary CTA interpretation by the cardiologist is attached. COMPARISON:  None. FINDINGS: Vascular: Heart is normal size.  Aorta normal caliber. Mediastinum/Nodes: No adenopathy. Lungs/Pleura: No confluent opacities or effusions. Upper Abdomen: Imaging into the upper abdomen demonstrates no acute findings. Musculoskeletal: Chest wall soft tissues are unremarkable. No acute bony abnormality. IMPRESSION: No acute or significant extracardiac abnormality. Electronically Signed: By: Charlett Nose M.D. On: 03/22/2020 16:39   CT CORONARY FRACTIONAL FLOW RESERVE DATA PREP  Result Date: 03/23/2020 EXAM: CT FFR ANALYSIS CLINICAL DATA:  Chest pain, nonspecific FINDINGS: FFRct analysis was performed on the original cardiac CT angiogram dataset.  Diagrammatic representation of the FFRct analysis is provided in a separate PDF document in PACS. This dictation was created using the PDF document and an interactive 3D model of the results. 3D model is not available in the EMR/PACS. Normal FFR range is >0.80. 1. Left Main: No significant stenosis. FFR = 1.00 2. LAD: Significant stenosis. Proximal FFR = 0.96, Mid FFR = 0.59, Distal FFR = <0.50 3. Large diagonal: Significant stenosis. Proximal FFR = 0.80, Proximal to mid FFR = 0.65 4. LCX: Significant stenosis. Proximal FFR = 0.95, Distal FFR = 0.63 5. RCA: Significant stenosis. Proximal FFR = 1.00, Proximal to mid FFR = 0.53, Distal FFR = <0.50 IMPRESSION: 1. CT FFR demonstrates severe, discrete multi-vessel  flow-limiting stenoses of the proximal to mid RCA, LAD, large diagonal and LCX arteries. 2.  Urgent cardiac catheterization is recommended. Electronically Signed   By: Chrystie Nose M.D.   On: 03/23/2020 07:51    Cardiac Studies   INTRAVASCULAR PRESSURE WIRE/FFR STUDY  LEFT HEART CATH AND CORONARY ANGIOGRAPHY  Conclusion  Conclusions: 1. Severe, multivessel coronary artery disease, as detailed below. 2. Normal left ventricular systolic function with mildly elevated filling pressure.  Recommendations: 1. Admit for cardiac surgery consultation for consideration of CABG, given multivessel CAD including proximal LAD/D1 involvement.  LAD and RCA could be treated percutaneously, with medical management of occluded ramus intermedius and non-critical distal LCx disease. 2. Aggressive secondary prevention. 3. Obtain echocardiogram.  Yvonne Kendall, MD [facility] Diagnostic Dominance: Right    Patient Profile     44 y.o. male with hx of hypertension, mild obstructive sleep apnea (not on CPAP), ongoing tobacco abuse, sedentary working as Naval architect,  depression and family history of heart disease in his mother who recently established care with Dr. Rennis Golden for chest pain. Follow up CT coronary was abnormal with flow limiting FFR stenosis who presented for outpatient cath.   Assessment & Plan    1. CAD  - Cardiac cath showed multivessel CAD. He was seen by Dr. Dorris Fetch and plan for CABG on 03/26/20. Pending echo and pre-surgical evaluation.  - HgbA1c 5.6 - LDL 58 - Scr normal - Continue ASA, statin, BB and ARB  2. HTN - BP stable on current medications   For questions or updates, please contact [facility] Please consult [email or web address] for contact info under        SignedManson Passey, PA  03/24/2020, 10:15 AM

## 2020-03-25 ENCOUNTER — Inpatient Hospital Stay (HOSPITAL_COMMUNITY): Payer: BLUE CROSS/BLUE SHIELD

## 2020-03-25 DIAGNOSIS — Z01810 Encounter for preprocedural cardiovascular examination: Secondary | ICD-10-CM

## 2020-03-25 DIAGNOSIS — I200 Unstable angina: Secondary | ICD-10-CM | POA: Diagnosis not present

## 2020-03-25 LAB — BLOOD GAS, ARTERIAL
Acid-Base Excess: 0.6 mmol/L (ref 0.0–2.0)
Bicarbonate: 24.2 mmol/L (ref 20.0–28.0)
Drawn by: 441371
FIO2: 21
O2 Saturation: 97.7 %
Patient temperature: 36.4
pCO2 arterial: 34.3 mmHg (ref 32.0–48.0)
pH, Arterial: 7.459 — ABNORMAL HIGH (ref 7.350–7.450)
pO2, Arterial: 94 mmHg (ref 83.0–108.0)

## 2020-03-25 LAB — BASIC METABOLIC PANEL
Anion gap: 10 (ref 5–15)
BUN: 10 mg/dL (ref 6–20)
CO2: 25 mmol/L (ref 22–32)
Calcium: 9 mg/dL (ref 8.9–10.3)
Chloride: 104 mmol/L (ref 98–111)
Creatinine, Ser: 0.77 mg/dL (ref 0.61–1.24)
GFR, Estimated: >60 mL/min (ref >60)
Glucose, Bld: 96 mg/dL (ref 70–99)
Potassium: 4 mmol/L (ref 3.5–5.1)
Sodium: 139 mmol/L (ref 135–145)

## 2020-03-25 LAB — CBC
HCT: 45.7 % (ref 39.0–52.0)
Hemoglobin: 15.3 g/dL (ref 13.0–17.0)
MCH: 31.4 pg (ref 26.0–34.0)
MCHC: 33.5 g/dL (ref 30.0–36.0)
MCV: 93.6 fL (ref 80.0–100.0)
Platelets: 136 10*3/uL — ABNORMAL LOW (ref 150–400)
RBC: 4.88 MIL/uL (ref 4.22–5.81)
RDW: 12.2 % (ref 11.5–15.5)
WBC: 8.1 10*3/uL (ref 4.0–10.5)
nRBC: 0.2 % (ref 0.0–0.2)

## 2020-03-25 LAB — PROTIME-INR
INR: 1.1 (ref 0.8–1.2)
Prothrombin Time: 13.6 seconds (ref 11.4–15.2)

## 2020-03-25 LAB — APTT: aPTT: 32 seconds (ref 24–36)

## 2020-03-25 MED ORDER — SODIUM CHLORIDE 0.9 % IV SOLN
INTRAVENOUS | Status: DC
  Filled 2020-03-25 (×2): qty 30

## 2020-03-25 MED ORDER — POTASSIUM CHLORIDE 2 MEQ/ML IV SOLN
80.0000 meq | INTRAVENOUS | Status: DC
  Filled 2020-03-25 (×2): qty 40

## 2020-03-25 MED ORDER — CHLORHEXIDINE GLUCONATE 0.12 % MT SOLN
15.0000 mL | Freq: Once | OROMUCOSAL | Status: AC
  Administered 2020-03-26: 15 mL via OROMUCOSAL
  Filled 2020-03-25: qty 15

## 2020-03-25 MED ORDER — PHENYLEPHRINE HCL-NACL 20-0.9 MG/250ML-% IV SOLN
30.0000 ug/min | INTRAVENOUS | Status: AC
  Administered 2020-03-26: 50 ug/min via INTRAVENOUS
  Filled 2020-03-25: qty 250

## 2020-03-25 MED ORDER — DIAZEPAM 5 MG PO TABS
5.0000 mg | ORAL_TABLET | Freq: Once | ORAL | Status: AC
  Administered 2020-03-26: 5 mg via ORAL
  Filled 2020-03-25: qty 1

## 2020-03-25 MED ORDER — BISACODYL 5 MG PO TBEC
5.0000 mg | DELAYED_RELEASE_TABLET | Freq: Once | ORAL | Status: AC
  Administered 2020-03-25: 5 mg via ORAL
  Filled 2020-03-25: qty 1

## 2020-03-25 MED ORDER — TRANEXAMIC ACID 1000 MG/10ML IV SOLN
1.5000 mg/kg/h | INTRAVENOUS | Status: AC
  Administered 2020-03-26: 1.5 mg/kg/h via INTRAVENOUS
  Filled 2020-03-25: qty 25

## 2020-03-25 MED ORDER — DEXMEDETOMIDINE HCL IN NACL 400 MCG/100ML IV SOLN
0.1000 ug/kg/h | INTRAVENOUS | Status: AC
  Administered 2020-03-26: .7 ug/kg/h via INTRAVENOUS
  Filled 2020-03-25: qty 100

## 2020-03-25 MED ORDER — TEMAZEPAM 15 MG PO CAPS: 15.0000 mg | ORAL_CAPSULE | Freq: Once | ORAL | Status: DC | PRN

## 2020-03-25 MED ORDER — NITROGLYCERIN IN D5W 200-5 MCG/ML-% IV SOLN
2.0000 ug/min | INTRAVENOUS | Status: AC
  Administered 2020-03-26: 16.6 ug/min via INTRAVENOUS
  Filled 2020-03-25: qty 250

## 2020-03-25 MED ORDER — MILRINONE LACTATE IN DEXTROSE 20-5 MG/100ML-% IV SOLN
0.3000 ug/kg/min | INTRAVENOUS | Status: DC
  Filled 2020-03-25: qty 100

## 2020-03-25 MED ORDER — METOPROLOL TARTRATE 12.5 MG HALF TABLET
12.5000 mg | ORAL_TABLET | Freq: Once | ORAL | Status: AC
  Administered 2020-03-26: 12.5 mg via ORAL
  Filled 2020-03-25: qty 1

## 2020-03-25 MED ORDER — PLASMA-LYTE 148 IV SOLN
INTRAVENOUS | Status: DC
  Filled 2020-03-25: qty 2.5

## 2020-03-25 MED ORDER — TRANEXAMIC ACID (OHS) BOLUS VIA INFUSION
15.0000 mg/kg | INTRAVENOUS | Status: AC
  Administered 2020-03-26: 1692 mg via INTRAVENOUS
  Filled 2020-03-25: qty 1692

## 2020-03-25 MED ORDER — SODIUM CHLORIDE 0.9 % IV SOLN
1.5000 g | INTRAVENOUS | Status: AC
  Administered 2020-03-26: 1.5 g via INTRAVENOUS
  Filled 2020-03-25: qty 1.5

## 2020-03-25 MED ORDER — CHLORHEXIDINE GLUCONATE CLOTH 2 % EX PADS
6.0000 | MEDICATED_PAD | Freq: Once | CUTANEOUS | Status: AC
  Administered 2020-03-25: 6 via TOPICAL

## 2020-03-25 MED ORDER — VANCOMYCIN HCL 1500 MG/300ML IV SOLN
1500.0000 mg | INTRAVENOUS | Status: AC
  Administered 2020-03-26: 1500 mg via INTRAVENOUS
  Filled 2020-03-25: qty 300

## 2020-03-25 MED ORDER — EPINEPHRINE HCL 5 MG/250ML IV SOLN IN NS
0.0000 ug/min | INTRAVENOUS | Status: DC
  Filled 2020-03-25 (×2): qty 250

## 2020-03-25 MED ORDER — TRANEXAMIC ACID (OHS) PUMP PRIME SOLUTION
2.0000 mg/kg | INTRAVENOUS | Status: DC
  Filled 2020-03-25: qty 2.26

## 2020-03-25 MED ORDER — CHLORHEXIDINE GLUCONATE CLOTH 2 % EX PADS
6.0000 | MEDICATED_PAD | Freq: Once | CUTANEOUS | Status: AC
  Administered 2020-03-26: 6 via TOPICAL

## 2020-03-25 MED ORDER — SODIUM CHLORIDE 0.9 % IV SOLN
750.0000 mg | INTRAVENOUS | Status: AC
  Administered 2020-03-26: 750 mg via INTRAVENOUS
  Filled 2020-03-25: qty 750

## 2020-03-25 MED ORDER — NOREPINEPHRINE 4 MG/250ML-% IV SOLN
0.0000 ug/min | INTRAVENOUS | Status: DC
  Filled 2020-03-25: qty 250

## 2020-03-25 MED ORDER — MAGNESIUM SULFATE 50 % IJ SOLN
40.0000 meq | INTRAMUSCULAR | Status: DC
  Filled 2020-03-25: qty 9.85

## 2020-03-25 MED ORDER — INSULIN REGULAR(HUMAN) IN NACL 100-0.9 UT/100ML-% IV SOLN
INTRAVENOUS | Status: AC
  Administered 2020-03-26: .7 [IU]/h via INTRAVENOUS
  Filled 2020-03-25: qty 100

## 2020-03-25 NOTE — Progress Notes (Signed)
I noticed patient didn't have PFTs. I called [place], PA for CVTS. He stated he did not need them. I also told him that there was nobody here today that could do the PFTs. I spoke with Lequita Halt, RT and she stated the earliest it could be done is tomorrow am. I made him aware and he stated it wasn't necessary.

## 2020-03-25 NOTE — Progress Notes (Signed)
Progress Note  Patient Name: Marc Johnston Date of Encounter: 03/25/2020  [facility] Cardiologist: Chrystie Nose, MD   Subjective   Annia Friendly trimmed no angina for CABG in am   Inpatient Medications    Scheduled Meds:  aspirin EC  81 mg Oral Daily   atorvastatin  40 mg Oral Daily   bisacodyl  5 mg Oral Once   [START ON 03/26/2020] chlorhexidine  15 mL Mouth/Throat Once   Chlorhexidine Gluconate Cloth  6 each Topical Once   And   [START ON 03/26/2020] Chlorhexidine Gluconate Cloth  6 each Topical Once   Chlorhexidine Gluconate Cloth  6 each Topical Daily   [START ON 03/26/2020] diazepam  5 mg Oral Once   enoxaparin (LOVENOX) injection  40 mg Subcutaneous Q24H   losartan  25 mg Oral Daily   metoprolol tartrate  12.5 mg Oral BID   [START ON 03/26/2020] metoprolol tartrate  12.5 mg Oral Once   mupirocin ointment  1 application Nasal BID   nicotine  21 mg Transdermal Daily   sodium chloride flush  3 mL Intravenous Q12H   Continuous Infusions:  sodium chloride     PRN Meds: sodium chloride, acetaminophen, ALPRAZolam, nitroGLYCERIN, ondansetron (ZOFRAN) IV, sodium chloride flush, temazepam   Vital Signs    Vitals:   03/24/20 1910 03/24/20 2342 03/25/20 0502 03/25/20 0813  BP: (!) 143/91 140/88 111/70 130/88  Pulse: 72 62 62 78  Resp: 18 18 18 18   Temp: 98.4 F (36.9 C) 98.7 F (37.1 C) 98.7 F (37.1 C) 97.6 F (36.4 C)  TempSrc: Oral Oral Oral Oral  SpO2: 98% 98% 99% 99%  Weight:   112.8 kg   Height:        Intake/Output Summary (Last 24 hours) at 03/25/2020 0827 Last data filed at 03/24/2020 2113 Gross per 24 hour  Intake 606 ml  Output 1 ml  Net 605 ml   Last 3 Weights 03/25/2020 03/24/2020 03/23/2020  Weight (lbs) 248 lb 9.8 oz 248 lb 13.3 oz 250 lb  Weight (kg) 112.77 kg 112.87 kg 113.399 kg      Telemetry    NSR - Personally Reviewed  ECG    SR at rate of 60 with TWI in lateral leads  - Personally Reviewed  Physical Exam     GEN: No acute distress.   Neck: No JVD Cardiac: RRR, no murmurs, rubs, or gallops. Right radial cath site without hematoma  Respiratory: Clear to auscultation bilaterally. GI: Soft, nontender, non-distended  MS: No edema; No deformity. Neuro:  Nonfocal  Psych: Normal affect   Labs    Chemistry Recent Labs  Lab 03/24/20 0214 03/24/20 2358  NA 140 139  K 3.7 4.0  CL 105 104  CO2 26 25  GLUCOSE 102* 96  BUN 10 10  CREATININE 0.84 0.77  CALCIUM 9.2 9.0  PROT 6.3*  --   ALBUMIN 3.3*  --   AST 22  --   ALT 35  --   ALKPHOS 87  --   BILITOT 0.7  --   GFRNONAA >60 >60  ANIONGAP 9 10     Hematology Recent Labs  Lab 03/24/20 0214 03/24/20 2358  WBC 8.0 8.1  RBC 4.96 4.88  HGB 15.4 15.3  HCT 46.8 45.7  MCV 94.4 93.6  MCH 31.0 31.4  MCHC 32.9 33.5  RDW 12.3 12.2  PLT 140* 136*    Radiology    DG Chest 2 View  Result Date: 03/24/2020 CLINICAL DATA:  Chest pain. EXAM: CHEST - 2 VIEW COMPARISON:  10/29/2015 FINDINGS: Oval radiodensity projects over the midline neck base likely external artifact. Lungs are adequately inflated and otherwise clear. Cardiomediastinal silhouette and remainder of the exam is unchanged. IMPRESSION: No active cardiopulmonary disease. Electronically Signed   By: Elberta Fortis M.D.   On: 03/24/2020 10:51   CARDIAC CATHETERIZATION  Result Date: 03/23/2020 Conclusions: 1. Severe, multivessel coronary artery disease, as detailed below. 2. Normal left ventricular systolic function with mildly elevated filling pressure. Recommendations: 1. Admit for cardiac surgery consultation for consideration of CABG, given multivessel CAD including proximal LAD/D1 involvement.  LAD and RCA could be treated percutaneously, with medical management of occluded ramus intermedius and non-critical distal LCx disease. 2. Aggressive secondary prevention. 3. Obtain echocardiogram. Yvonne Kendall, MD [facility]   ECHOCARDIOGRAM COMPLETE  Result Date: 03/24/2020     ECHOCARDIOGRAM REPORT   Patient Name:   Marc Johnston Date of Exam: 03/24/2020 Medical Rec #:  [id number]      Height:       70.0 in Accession #:    [id number]     Weight:       248.8 lb Date of Birth:  May 08, 1975      BSA:          2.290 m Patient Age:    44 years       BP:           146/101 mmHg Patient Gender: M              HR:           61 bpm. Exam Location:  Inpatient Procedure: 2D Echo Indications:    coronary artery disease  History:        Patient has no prior history of Echocardiogram examinations.                 Signs/Symptoms:Chest Pain; Risk Factors:Hypertension and Sleep                 Apnea.  Sonographer:    Delcie Roch Referring Phys: Jessy.Ferdinand [name] IMPRESSIONS  1. Inferior basal hypokinesis. Left ventricular ejection fraction, by estimation, is 50 to 55%. The left ventricle has low normal function. The left ventricle has no regional wall motion abnormalities. Left ventricular diastolic parameters were normal.  2. Right ventricular systolic function is normal. The right ventricular size is normal.  3. Left atrial size was mildly dilated.  4. The mitral valve is normal in structure. Trivial mitral valve regurgitation. No evidence of mitral stenosis.  5. The aortic valve is tricuspid. Aortic valve regurgitation is not visualized. No aortic stenosis is present.  6. The inferior vena cava is normal in size with greater than 50% respiratory variability, suggesting right atrial pressure of 3 mmHg. FINDINGS  Left Ventricle: Inferior basal hypokinesis. Left ventricular ejection fraction, by estimation, is 50 to 55%. The left ventricle has low normal function. The left ventricle has no regional wall motion abnormalities. The left ventricular internal cavity size was normal in size. There is no left ventricular hypertrophy. Left ventricular diastolic parameters were normal. Right Ventricle: The right ventricular size is normal. No increase in right ventricular wall thickness. Right ventricular  systolic function is normal. Left Atrium: Left atrial size was mildly dilated. Right Atrium: Right atrial size was normal in size. Pericardium: There is no evidence of pericardial effusion. Mitral Valve: The mitral valve is normal in structure. Trivial mitral valve regurgitation. No evidence of mitral valve stenosis.  Tricuspid Valve: The tricuspid valve is normal in structure. Tricuspid valve regurgitation is trivial. No evidence of tricuspid stenosis. Aortic Valve: The aortic valve is tricuspid. Aortic valve regurgitation is not visualized. No aortic stenosis is present. Pulmonic Valve: The pulmonic valve was normal in structure. Pulmonic valve regurgitation is not visualized. No evidence of pulmonic stenosis. Aorta: The aortic root is normal in size and structure. Venous: The inferior vena cava is normal in size with greater than 50% respiratory variability, suggesting right atrial pressure of 3 mmHg. IAS/Shunts: No atrial level shunt detected by color flow Doppler.  LEFT VENTRICLE PLAX 2D LVIDd:         5.50 cm  Diastology LVIDs:         4.10 cm  LV e' medial:    7.94 cm/s LV PW:         1.20 cm  LV E/e' medial:  9.7 LV IVS:        0.80 cm  LV e' lateral:   9.46 cm/s LVOT diam:     2.10 cm  LV E/e' lateral: 8.1 LV SV:         82 LV SV Index:   36 LVOT Area:     3.46 cm  RIGHT VENTRICLE             IVC RV S prime:     12.80 cm/s  IVC diam: 1.50 cm TAPSE (M-mode): 2.4 cm LEFT ATRIUM             Index       RIGHT ATRIUM           Index LA diam:        4.10 cm 1.79 cm/m  RA Area:     13.00 cm LA Vol (A2C):   29.7 ml 12.97 ml/m RA Volume:   30.40 ml  13.27 ml/m LA Vol (A4C):   47.5 ml 20.74 ml/m LA Biplane Vol: 41.9 ml 18.29 ml/m  AORTIC VALVE LVOT Vmax:   119.00 cm/s LVOT Vmean:  77.600 cm/s LVOT VTI:    0.237 m  AORTA Ao Root diam: 3.50 cm Ao Asc diam:  3.60 cm MITRAL VALVE MV Area (PHT): 2.95 cm    SHUNTS MV Decel Time: 257 msec    Systemic VTI:  0.24 m MV E velocity: 76.70 cm/s  Systemic Diam: 2.10 cm MV A  velocity: 64.70 cm/s MV E/A ratio:  1.19 Charlton HawsPeter Nuriya Stuck MD Electronically signed by Charlton HawsPeter Jashae Wiggs MD Signature Date/Time: 03/24/2020/4:51:02 PM    Final     Cardiac Studies   INTRAVASCULAR PRESSURE WIRE/FFR STUDY  LEFT HEART CATH AND CORONARY ANGIOGRAPHY  Conclusion  Conclusions: 1. Severe, multivessel coronary artery disease, as detailed below. 2. Normal left ventricular systolic function with mildly elevated filling pressure.  Recommendations: 1. Admit for cardiac surgery consultation for consideration of CABG, given multivessel CAD including proximal LAD/D1 involvement.  LAD and RCA could be treated percutaneously, with medical management of occluded ramus intermedius and non-critical distal LCx disease. 2. Aggressive secondary prevention. 3. Obtain echocardiogram.  [name] End, MD [facility] Diagnostic Dominance: Right    Patient Profile     44 y.o. male with hx of hypertension, mild obstructive sleep apnea (not on CPAP), ongoing tobacco abuse, sedentary working as Naval architecttruck driver,  depression and family history of heart disease in his mother who recently established care with Dr. Rennis GoldenHilty for chest pain. Follow up CT coronary was abnormal with flow limiting FFR stenosis who presented for outpatient cath.  Assessment & Plan    1. CAD  - Cardiac cath showed multivessel CAD. He was seen by Dr. Dorris Fetch and plan for CABG on 03/26/20. TTE with EF 50-55% Inferior basal hypokinesis no significant valve disease  - HgbA1c 5.6 - LDL 58 - Scr normal - Continue ASA, statin, BB and ARB  2. HTN - BP stable on current medications   For questions or updates, please contact [facility] Please consult [email or web address] for contact info under        Signed, Charlton Haws, MD  03/25/2020, 8:27 AM

## 2020-03-25 NOTE — Progress Notes (Signed)
VASCULAR LAB    Pre CABG Dopplers have been performed.  See CV proc for preliminary results.   Francois Elk, RVT 03/25/2020, 9:33 AM

## 2020-03-25 NOTE — Progress Notes (Signed)
      [address]       Marc Johnston [address]             [phone]      Denies chest pain. Admits he is anxious about bein away from his children over the Thanksgiving holiday but says he is ready to proceed with surgery tomorrow. Questions about the expected  post-op course were answered.   Gaynelle Arabian, PA-C

## 2020-03-25 NOTE — Anesthesia Preprocedure Evaluation (Addendum)
Anesthesia Evaluation  Patient identified by MRN, date of birth, ID band Patient awake    Reviewed: Allergy & Precautions, NPO status , Patient's Chart, lab work & pertinent test results  History of Anesthesia Complications (+) Emergence Delirium and history of anesthetic complications  Airway Mallampati: III  TM Distance: >3 FB Neck ROM: Full    Dental no notable dental hx. (+) Dental Advisory Given   Pulmonary sleep apnea , Current Smoker,    Pulmonary exam normal - rhonchi (-) wheezing      Cardiovascular hypertension, Pt. on medications and Pt. on home beta blockers + CAD  (-) Past MI, (-) Cardiac Stents and (-) CABG Normal cardiovascular exam - Systolic murmurs and - Diastolic murmurs IMPRESSIONS   1. Inferior basal hypokinesis. Left ventricular ejection fraction, by estimation, is 50 to 55%. The left ventricle has low normal function. The left ventricle has no regional wall motion abnormalities. Left ventricular diastolic parameters were normal. 2. Right ventricular systolic function is normal. The right ventricular size is normal. 3. Left atrial size was mildly dilated. 4. The mitral valve is normal in structure. Trivial mitral valve regurgitation. No evidence of mitral stenosis. 5. The aortic valve is tricuspid. Aortic valve regurgitation is not visualized. No aortic stenosis is present. 6. The inferior vena cava is normal in size with greater than 50% respiratory variability, suggesting right atrial pressure of 3 mmHg.  Narrative Conclusions: 1. Severe, multivessel coronary artery disease, as detailed below. 2. Normal left ventricular systolic function with mildly elevated filling  pressure.  Recommendations: 1. Admit for cardiac surgery consultation for consideration of CABG, given  multivessel CAD including proximal LAD/D1 involvement. LAD and RCA could  be treated percutaneously, with medical management  of occluded ramus  intermedius and non-critical distal LCx disease. 2. Aggressive secondary prevention. 3. Obtain echocardiogram.    Neuro/Psych  Headaches, PSYCHIATRIC DISORDERS Depression    GI/Hepatic negative GI ROS, Neg liver ROS,   Endo/Other    Renal/GU negative Renal ROS     Musculoskeletal negative musculoskeletal ROS (+)   Abdominal (+) - obese,   Peds  Hematology negative hematology ROS (+)   Anesthesia Other Findings    Reproductive/Obstetrics                            Anesthesia Physical  Anesthesia Plan  ASA: IV  Anesthesia Plan: General   Post-op Pain Management:    Induction: Intravenous  PONV Risk Score and Plan: 2 and Ondansetron and Midazolam  Airway Management Planned: Oral ETT  Additional Equipment: Arterial line, PA Cath, 3D TEE and Ultrasound Guidance Line Placement  Intra-op Plan:   Post-operative Plan: Post-operative intubation/ventilation  Informed Consent: I have reviewed the patients History and Physical, chart, labs and discussed the procedure including the risks, benefits and alternatives for the proposed anesthesia with the patient or authorized representative who has indicated his/her understanding and acceptance.     Dental advisory given  Plan Discussed with: Anesthesiologist and CRNA  Anesthesia Plan Comments:        Anesthesia Quick Evaluation

## 2020-03-25 NOTE — Progress Notes (Addendum)
Patient received cardiac surgery booklet and incentive spirometer

## 2020-03-26 ENCOUNTER — Inpatient Hospital Stay (HOSPITAL_COMMUNITY): Payer: BLUE CROSS/BLUE SHIELD

## 2020-03-26 ENCOUNTER — Inpatient Hospital Stay (HOSPITAL_COMMUNITY)
Admission: RE | Disposition: A | Discharge status: Home / Self Care | Payer: Self-pay | Attending: Thoracic Surgery (Cardiothoracic Vascular Surgery)

## 2020-03-26 ENCOUNTER — Encounter (HOSPITAL_COMMUNITY): Payer: Self-pay | Admitting: Internal Medicine

## 2020-03-26 ENCOUNTER — Inpatient Hospital Stay (HOSPITAL_COMMUNITY): Payer: BLUE CROSS/BLUE SHIELD | Admitting: Anesthesiology

## 2020-03-26 DIAGNOSIS — Z951 Presence of aortocoronary bypass graft: Secondary | ICD-10-CM

## 2020-03-26 DIAGNOSIS — I2510 Atherosclerotic heart disease of native coronary artery without angina pectoris: Secondary | ICD-10-CM | POA: Diagnosis present

## 2020-03-26 HISTORY — PX: RADIAL ARTERY HARVEST: SHX5067

## 2020-03-26 HISTORY — PX: ENDOVEIN HARVEST OF GREATER SAPHENOUS VEIN: SHX5059

## 2020-03-26 HISTORY — PX: CORONARY ARTERY BYPASS GRAFT: SHX141

## 2020-03-26 HISTORY — PX: TEE WITHOUT CARDIOVERSION: SHX5443

## 2020-03-26 LAB — CBC
HCT: 37.6 % — ABNORMAL LOW (ref 39.0–52.0)
HCT: 39.3 % (ref 39.0–52.0)
Hemoglobin: 12.5 g/dL — ABNORMAL LOW (ref 13.0–17.0)
Hemoglobin: 13.2 g/dL (ref 13.0–17.0)
MCH: 31.6 pg (ref 26.0–34.0)
MCH: 31.7 pg (ref 26.0–34.0)
MCHC: 33.2 g/dL (ref 30.0–36.0)
MCHC: 33.6 g/dL (ref 30.0–36.0)
MCV: 95.2 fL (ref 80.0–100.0)
MCV: 94.5 fL (ref 80.0–100.0)
Platelets: 95 10*3/uL — ABNORMAL LOW (ref 150–400)
Platelets: 120 10*3/uL — ABNORMAL LOW (ref 150–400)
RBC: 3.95 MIL/uL — ABNORMAL LOW (ref 4.22–5.81)
RBC: 4.16 MIL/uL — ABNORMAL LOW (ref 4.22–5.81)
RDW: 12.1 % (ref 11.5–15.5)
RDW: 12.1 % (ref 11.5–15.5)
WBC: 16.2 10*3/uL — ABNORMAL HIGH (ref 4.0–10.5)
WBC: 17.3 10*3/uL — ABNORMAL HIGH (ref 4.0–10.5)
nRBC: 0 % (ref 0.0–0.2)
nRBC: 0 % (ref 0.0–0.2)

## 2020-03-26 LAB — POCT I-STAT, CHEM 8
BUN: 11 mg/dL (ref 6–20)
BUN: 10 mg/dL (ref 6–20)
BUN: 9 mg/dL (ref 6–20)
BUN: 8 mg/dL (ref 6–20)
BUN: 9 mg/dL (ref 6–20)
Calcium, Ion: 1.28 mmol/L (ref 1.15–1.40)
Calcium, Ion: 1.21 mmol/L (ref 1.15–1.40)
Calcium, Ion: 1.04 mmol/L — ABNORMAL LOW (ref 1.15–1.40)
Calcium, Ion: 1.06 mmol/L — ABNORMAL LOW (ref 1.15–1.40)
Calcium, Ion: 1.1 mmol/L — ABNORMAL LOW (ref 1.15–1.40)
Chloride: 102 mmol/L (ref 98–111)
Chloride: 103 mmol/L (ref 98–111)
Chloride: 103 mmol/L (ref 98–111)
Chloride: 104 mmol/L (ref 98–111)
Chloride: 104 mmol/L (ref 98–111)
Creatinine, Ser: 0.5 mg/dL — ABNORMAL LOW (ref 0.61–1.24)
Creatinine, Ser: 0.5 mg/dL — ABNORMAL LOW (ref 0.61–1.24)
Creatinine, Ser: 0.5 mg/dL — ABNORMAL LOW (ref 0.61–1.24)
Creatinine, Ser: 0.4 mg/dL — ABNORMAL LOW (ref 0.61–1.24)
Creatinine, Ser: 0.4 mg/dL — ABNORMAL LOW (ref 0.61–1.24)
Glucose, Bld: 98 mg/dL (ref 70–99)
Glucose, Bld: 99 mg/dL (ref 70–99)
Glucose, Bld: 107 mg/dL — ABNORMAL HIGH (ref 70–99)
Glucose, Bld: 109 mg/dL — ABNORMAL HIGH (ref 70–99)
Glucose, Bld: 123 mg/dL — ABNORMAL HIGH (ref 70–99)
HCT: 44 % (ref 39.0–52.0)
HCT: 41 % (ref 39.0–52.0)
HCT: 37 % — ABNORMAL LOW (ref 39.0–52.0)
HCT: 33 % — ABNORMAL LOW (ref 39.0–52.0)
HCT: 35 % — ABNORMAL LOW (ref 39.0–52.0)
Hemoglobin: 15 g/dL (ref 13.0–17.0)
Hemoglobin: 13.9 g/dL (ref 13.0–17.0)
Hemoglobin: 12.6 g/dL — ABNORMAL LOW (ref 13.0–17.0)
Hemoglobin: 11.2 g/dL — ABNORMAL LOW (ref 13.0–17.0)
Hemoglobin: 11.9 g/dL — ABNORMAL LOW (ref 13.0–17.0)
Potassium: 4.1 mmol/L (ref 3.5–5.1)
Potassium: 4.7 mmol/L (ref 3.5–5.1)
Potassium: 5.9 mmol/L — ABNORMAL HIGH (ref 3.5–5.1)
Potassium: 4.5 mmol/L (ref 3.5–5.1)
Potassium: 5.6 mmol/L — ABNORMAL HIGH (ref 3.5–5.1)
Sodium: 140 mmol/L (ref 135–145)
Sodium: 139 mmol/L (ref 135–145)
Sodium: 138 mmol/L (ref 135–145)
Sodium: 137 mmol/L (ref 135–145)
Sodium: 140 mmol/L (ref 135–145)
TCO2: 26 mmol/L (ref 22–32)
TCO2: 24 mmol/L (ref 22–32)
TCO2: 23 mmol/L (ref 22–32)
TCO2: 22 mmol/L (ref 22–32)
TCO2: 24 mmol/L (ref 22–32)

## 2020-03-26 LAB — POCT I-STAT 7, (LYTES, BLD GAS, ICA,H+H)
Acid-Base Excess: 1 mmol/L (ref 0.0–2.0)
Acid-Base Excess: 4 mmol/L — ABNORMAL HIGH (ref 0.0–2.0)
Acid-Base Excess: 1 mmol/L (ref 0.0–2.0)
Acid-base deficit: 3 mmol/L — ABNORMAL HIGH (ref 0.0–2.0)
Acid-base deficit: 6 mmol/L — ABNORMAL HIGH (ref 0.0–2.0)
Acid-base deficit: 2 mmol/L (ref 0.0–2.0)
Acid-base deficit: 4 mmol/L — ABNORMAL HIGH (ref 0.0–2.0)
Acid-base deficit: 3 mmol/L — ABNORMAL HIGH (ref 0.0–2.0)
Bicarbonate: 28.7 mmol/L — ABNORMAL HIGH (ref 20.0–28.0)
Bicarbonate: 29.4 mmol/L — ABNORMAL HIGH (ref 20.0–28.0)
Bicarbonate: 25.3 mmol/L (ref 20.0–28.0)
Bicarbonate: 23.3 mmol/L (ref 20.0–28.0)
Bicarbonate: 21 mmol/L (ref 20.0–28.0)
Bicarbonate: 23.6 mmol/L (ref 20.0–28.0)
Bicarbonate: 21.6 mmol/L (ref 20.0–28.0)
Bicarbonate: 23.1 mmol/L (ref 20.0–28.0)
Calcium, Ion: 1.25 mmol/L (ref 1.15–1.40)
Calcium, Ion: 0.99 mmol/L — ABNORMAL LOW (ref 1.15–1.40)
Calcium, Ion: 1.05 mmol/L — ABNORMAL LOW (ref 1.15–1.40)
Calcium, Ion: 1.08 mmol/L — ABNORMAL LOW (ref 1.15–1.40)
Calcium, Ion: 1.06 mmol/L — ABNORMAL LOW (ref 1.15–1.40)
Calcium, Ion: 1.09 mmol/L — ABNORMAL LOW (ref 1.15–1.40)
Calcium, Ion: 1.07 mmol/L — ABNORMAL LOW (ref 1.15–1.40)
Calcium, Ion: 1.14 mmol/L — ABNORMAL LOW (ref 1.15–1.40)
HCT: 45 % (ref 39.0–52.0)
HCT: 33 % — ABNORMAL LOW (ref 39.0–52.0)
HCT: 33 % — ABNORMAL LOW (ref 39.0–52.0)
HCT: 35 % — ABNORMAL LOW (ref 39.0–52.0)
HCT: 33 % — ABNORMAL LOW (ref 39.0–52.0)
HCT: 35 % — ABNORMAL LOW (ref 39.0–52.0)
HCT: 35 % — ABNORMAL LOW (ref 39.0–52.0)
HCT: 38 % — ABNORMAL LOW (ref 39.0–52.0)
Hemoglobin: 15.3 g/dL (ref 13.0–17.0)
Hemoglobin: 11.2 g/dL — ABNORMAL LOW (ref 13.0–17.0)
Hemoglobin: 11.2 g/dL — ABNORMAL LOW (ref 13.0–17.0)
Hemoglobin: 11.9 g/dL — ABNORMAL LOW (ref 13.0–17.0)
Hemoglobin: 11.2 g/dL — ABNORMAL LOW (ref 13.0–17.0)
Hemoglobin: 11.9 g/dL — ABNORMAL LOW (ref 13.0–17.0)
Hemoglobin: 11.9 g/dL — ABNORMAL LOW (ref 13.0–17.0)
Hemoglobin: 12.9 g/dL — ABNORMAL LOW (ref 13.0–17.0)
O2 Saturation: 100 %
O2 Saturation: 100 %
O2 Saturation: 100 %
O2 Saturation: 92 %
O2 Saturation: 97 %
O2 Saturation: 96 %
O2 Saturation: 97 %
O2 Saturation: 96 %
Patient temperature: 36.1
Patient temperature: 36.3
Patient temperature: 36.4
Patient temperature: 36.6
Potassium: 4.2 mmol/L (ref 3.5–5.1)
Potassium: 5.2 mmol/L — ABNORMAL HIGH (ref 3.5–5.1)
Potassium: 5.9 mmol/L — ABNORMAL HIGH (ref 3.5–5.1)
Potassium: 4.6 mmol/L (ref 3.5–5.1)
Potassium: 3.5 mmol/L (ref 3.5–5.1)
Potassium: 4.1 mmol/L (ref 3.5–5.1)
Potassium: 4.6 mmol/L (ref 3.5–5.1)
Potassium: 5.1 mmol/L (ref 3.5–5.1)
Sodium: 140 mmol/L (ref 135–145)
Sodium: 140 mmol/L (ref 135–145)
Sodium: 139 mmol/L (ref 135–145)
Sodium: 141 mmol/L (ref 135–145)
Sodium: 144 mmol/L (ref 135–145)
Sodium: 142 mmol/L (ref 135–145)
Sodium: 142 mmol/L (ref 135–145)
Sodium: 141 mmol/L (ref 135–145)
TCO2: 30 mmol/L (ref 22–32)
TCO2: 31 mmol/L (ref 22–32)
TCO2: 26 mmol/L (ref 22–32)
TCO2: 25 mmol/L (ref 22–32)
TCO2: 22 mmol/L (ref 22–32)
TCO2: 25 mmol/L (ref 22–32)
TCO2: 23 mmol/L (ref 22–32)
TCO2: 24 mmol/L (ref 22–32)
pCO2 arterial: 56.3 mmHg — ABNORMAL HIGH (ref 32.0–48.0)
pCO2 arterial: 47.7 mmHg (ref 32.0–48.0)
pCO2 arterial: 36.6 mmHg (ref 32.0–48.0)
pCO2 arterial: 43.4 mmHg (ref 32.0–48.0)
pCO2 arterial: 44 mmHg (ref 32.0–48.0)
pCO2 arterial: 43.1 mmHg (ref 32.0–48.0)
pCO2 arterial: 39.4 mmHg (ref 32.0–48.0)
pCO2 arterial: 44.4 mmHg (ref 32.0–48.0)
pH, Arterial: 7.315 — ABNORMAL LOW (ref 7.350–7.450)
pH, Arterial: 7.398 (ref 7.350–7.450)
pH, Arterial: 7.448 (ref 7.350–7.450)
pH, Arterial: 7.338 — ABNORMAL LOW (ref 7.350–7.450)
pH, Arterial: 7.283 — ABNORMAL LOW (ref 7.350–7.450)
pH, Arterial: 7.342 — ABNORMAL LOW (ref 7.350–7.450)
pH, Arterial: 7.344 — ABNORMAL LOW (ref 7.350–7.450)
pH, Arterial: 7.323 — ABNORMAL LOW (ref 7.350–7.450)
pO2, Arterial: 288 mmHg — ABNORMAL HIGH (ref 83.0–108.0)
pO2, Arterial: 389 mmHg — ABNORMAL HIGH (ref 83.0–108.0)
pO2, Arterial: 225 mmHg — ABNORMAL HIGH (ref 83.0–108.0)
pO2, Arterial: 67 mmHg — ABNORMAL LOW (ref 83.0–108.0)
pO2, Arterial: 101 mmHg (ref 83.0–108.0)
pO2, Arterial: 83 mmHg (ref 83.0–108.0)
pO2, Arterial: 90 mmHg (ref 83.0–108.0)
pO2, Arterial: 85 mmHg (ref 83.0–108.0)

## 2020-03-26 LAB — GLUCOSE, CAPILLARY
Glucose-Capillary: 193 mg/dL — ABNORMAL HIGH (ref 70–99)
Glucose-Capillary: 54 mg/dL — ABNORMAL LOW (ref 70–99)
Glucose-Capillary: 146 mg/dL — ABNORMAL HIGH (ref 70–99)
Glucose-Capillary: 118 mg/dL — ABNORMAL HIGH (ref 70–99)
Glucose-Capillary: 130 mg/dL — ABNORMAL HIGH (ref 70–99)
Glucose-Capillary: 130 mg/dL — ABNORMAL HIGH (ref 70–99)
Glucose-Capillary: 98 mg/dL (ref 70–99)
Glucose-Capillary: 87 mg/dL (ref 70–99)
Glucose-Capillary: 69 mg/dL — ABNORMAL LOW (ref 70–99)
Glucose-Capillary: 200 mg/dL — ABNORMAL HIGH (ref 70–99)

## 2020-03-26 LAB — POCT I-STAT EG7
Acid-Base Excess: 2 mmol/L (ref 0.0–2.0)
Bicarbonate: 27.2 mmol/L (ref 20.0–28.0)
Calcium, Ion: 1.1 mmol/L — ABNORMAL LOW (ref 1.15–1.40)
HCT: 35 % — ABNORMAL LOW (ref 39.0–52.0)
Hemoglobin: 11.9 g/dL — ABNORMAL LOW (ref 13.0–17.0)
O2 Saturation: 81 %
Potassium: 6 mmol/L — ABNORMAL HIGH (ref 3.5–5.1)
Sodium: 139 mmol/L (ref 135–145)
TCO2: 29 mmol/L (ref 22–32)
pCO2, Ven: 46.3 mmHg (ref 44.0–60.0)
pH, Ven: 7.377 (ref 7.250–7.430)
pO2, Ven: 47 mmHg — ABNORMAL HIGH (ref 32.0–45.0)

## 2020-03-26 LAB — PLATELET COUNT: Platelets: 117 10*3/uL — ABNORMAL LOW (ref 150–400)

## 2020-03-26 LAB — HEMOGLOBIN AND HEMATOCRIT, BLOOD
HCT: 34.7 % — ABNORMAL LOW (ref 39.0–52.0)
Hemoglobin: 11.6 g/dL — ABNORMAL LOW (ref 13.0–17.0)

## 2020-03-26 LAB — BASIC METABOLIC PANEL
Anion gap: 4 — ABNORMAL LOW (ref 5–15)
BUN: 9 mg/dL (ref 6–20)
CO2: 24 mmol/L (ref 22–32)
Calcium: 7.7 mg/dL — ABNORMAL LOW (ref 8.9–10.3)
Chloride: 108 mmol/L (ref 98–111)
Creatinine, Ser: 0.63 mg/dL (ref 0.61–1.24)
GFR, Estimated: >60 mL/min (ref >60)
Glucose, Bld: 135 mg/dL — ABNORMAL HIGH (ref 70–99)
Potassium: 4.7 mmol/L (ref 3.5–5.1)
Sodium: 136 mmol/L (ref 135–145)

## 2020-03-26 LAB — ECHO INTRAOPERATIVE TEE
Height: 70 in
Weight: 3977.8 oz

## 2020-03-26 LAB — MAGNESIUM: Magnesium: 2.8 mg/dL — ABNORMAL HIGH (ref 1.7–2.4)

## 2020-03-26 LAB — APTT: aPTT: 34 seconds (ref 24–36)

## 2020-03-26 LAB — PROTIME-INR
INR: 1.6 — ABNORMAL HIGH (ref 0.8–1.2)
Prothrombin Time: 18.6 seconds — ABNORMAL HIGH (ref 11.4–15.2)

## 2020-03-26 SURGERY — CORONARY ARTERY BYPASS GRAFTING (CABG)
Anesthesia: General | Site: Chest

## 2020-03-26 MED ORDER — BISACODYL 10 MG RE SUPP: 10.0000 mg | Freq: Every day | RECTAL | Status: DC

## 2020-03-26 MED ORDER — FENTANYL CITRATE (PF) 250 MCG/5ML IJ SOLN
INTRAMUSCULAR | Status: AC
  Filled 2020-03-26: qty 20

## 2020-03-26 MED ORDER — TRAMADOL HCL 50 MG PO TABS
50.0000 mg | ORAL_TABLET | ORAL | Status: DC | PRN
  Administered 2020-03-27 – 2020-03-28 (×2): 100 mg via ORAL
  Filled 2020-03-26 (×2): qty 2

## 2020-03-26 MED ORDER — PROTAMINE SULFATE 10 MG/ML IV SOLN
INTRAVENOUS | Status: AC
  Filled 2020-03-26: qty 50

## 2020-03-26 MED ORDER — SODIUM CHLORIDE 0.9 % IV SOLN: INTRAVENOUS | Status: DC

## 2020-03-26 MED ORDER — ACETAMINOPHEN 160 MG/5ML PO SOLN: 1000.0000 mg | Freq: Four times a day (QID) | ORAL | Status: DC

## 2020-03-26 MED ORDER — CALCIUM CHLORIDE 10 % IV SOLN
INTRAVENOUS | Status: DC | PRN
  Administered 2020-03-26 (×2): 200 mg via INTRAVENOUS

## 2020-03-26 MED ORDER — HEPARIN SODIUM (PORCINE) 1000 UNIT/ML IJ SOLN
INTRAMUSCULAR | Status: AC
  Filled 2020-03-26: qty 1

## 2020-03-26 MED ORDER — OXYCODONE HCL 5 MG PO TABS
5.0000 mg | ORAL_TABLET | ORAL | Status: DC | PRN
  Administered 2020-03-27 – 2020-03-30 (×9): 10 mg via ORAL
  Administered 2020-03-30: 5 mg via ORAL
  Filled 2020-03-26 (×6): qty 2
  Filled 2020-03-26: qty 1
  Filled 2020-03-26 (×3): qty 2

## 2020-03-26 MED ORDER — NITROGLYCERIN IN D5W 200-5 MCG/ML-% IV SOLN: 7.0000 ug/min | INTRAVENOUS | Status: AC

## 2020-03-26 MED ORDER — SODIUM CHLORIDE 0.9% FLUSH
10.0000 mL | Freq: Two times a day (BID) | INTRAVENOUS | Status: DC
  Administered 2020-03-26 – 2020-03-28 (×4): 10 mL

## 2020-03-26 MED ORDER — FENTANYL CITRATE (PF) 250 MCG/5ML IJ SOLN
INTRAMUSCULAR | Status: AC
  Filled 2020-03-26: qty 5

## 2020-03-26 MED ORDER — INSULIN REGULAR(HUMAN) IN NACL 100-0.9 UT/100ML-% IV SOLN: INTRAVENOUS | Status: DC

## 2020-03-26 MED ORDER — SODIUM BICARBONATE 8.4 % IV SOLN
50.0000 meq | Freq: Once | INTRAVENOUS | Status: AC
  Administered 2020-03-26: 50 meq via INTRAVENOUS

## 2020-03-26 MED ORDER — LACTATED RINGERS IV SOLN: 500.0000 mL | Freq: Once | INTRAVENOUS | Status: DC | PRN

## 2020-03-26 MED ORDER — 0.9 % SODIUM CHLORIDE (POUR BTL) OPTIME
TOPICAL | Status: DC | PRN
  Administered 2020-03-26: 5000 mL

## 2020-03-26 MED ORDER — ROCURONIUM BROMIDE 10 MG/ML (PF) SYRINGE
PREFILLED_SYRINGE | INTRAVENOUS | Status: DC | PRN
  Administered 2020-03-26: 100 mg via INTRAVENOUS
  Administered 2020-03-26 (×3): 50 mg via INTRAVENOUS
  Administered 2020-03-26: 20 mg via INTRAVENOUS

## 2020-03-26 MED ORDER — ONDANSETRON HCL 4 MG/2ML IJ SOLN: 4.0000 mg | Freq: Four times a day (QID) | INTRAMUSCULAR | Status: DC | PRN

## 2020-03-26 MED ORDER — DEXMEDETOMIDINE HCL IN NACL 400 MCG/100ML IV SOLN
0.0000 ug/kg/h | INTRAVENOUS | Status: DC
  Administered 2020-03-26: 0.7 ug/kg/h via INTRAVENOUS
  Filled 2020-03-26: qty 100

## 2020-03-26 MED ORDER — PROTAMINE SULFATE 10 MG/ML IV SOLN
INTRAVENOUS | Status: DC | PRN
  Administered 2020-03-26: 50 mg via INTRAVENOUS
  Administered 2020-03-26 (×2): 25 mg via INTRAVENOUS
  Administered 2020-03-26: 50 mg via INTRAVENOUS
  Administered 2020-03-26: 25 mg via INTRAVENOUS
  Administered 2020-03-26: 15 mg via INTRAVENOUS
  Administered 2020-03-26 (×2): 25 mg via INTRAVENOUS
  Administered 2020-03-26: 50 mg via INTRAVENOUS
  Administered 2020-03-26: 10 mg via INTRAVENOUS
  Administered 2020-03-26 (×2): 50 mg via INTRAVENOUS

## 2020-03-26 MED ORDER — DEXTROSE 50 % IV SOLN
INTRAVENOUS | Status: AC
  Administered 2020-03-26: 40 mL
  Filled 2020-03-26: qty 50

## 2020-03-26 MED ORDER — BISACODYL 5 MG PO TBEC
10.0000 mg | DELAYED_RELEASE_TABLET | Freq: Every day | ORAL | Status: DC
  Administered 2020-03-27 – 2020-03-29 (×3): 10 mg via ORAL
  Filled 2020-03-26 (×5): qty 2

## 2020-03-26 MED ORDER — ISOSORBIDE MONONITRATE ER 30 MG PO TB24
15.0000 mg | ORAL_TABLET | Freq: Every day | ORAL | Status: DC
  Administered 2020-03-27 – 2020-03-30 (×4): 15 mg via ORAL
  Filled 2020-03-26 (×4): qty 1

## 2020-03-26 MED ORDER — MIDAZOLAM HCL (PF) 5 MG/ML IJ SOLN
INTRAMUSCULAR | Status: DC | PRN
  Administered 2020-03-26: 2 mg via INTRAVENOUS
  Administered 2020-03-26: 3 mg via INTRAVENOUS
  Administered 2020-03-26: 5 mg via INTRAVENOUS

## 2020-03-26 MED ORDER — FENTANYL CITRATE (PF) 250 MCG/5ML IJ SOLN
INTRAMUSCULAR | Status: DC | PRN
  Administered 2020-03-26: 250 ug via INTRAVENOUS
  Administered 2020-03-26: 650 ug via INTRAVENOUS
  Administered 2020-03-26: 250 ug via INTRAVENOUS
  Administered 2020-03-26: 100 ug via INTRAVENOUS

## 2020-03-26 MED ORDER — PHENYLEPHRINE HCL-NACL 20-0.9 MG/250ML-% IV SOLN: 0.0000 ug/min | INTRAVENOUS | Status: DC

## 2020-03-26 MED ORDER — ASPIRIN EC 325 MG PO TBEC
325.0000 mg | DELAYED_RELEASE_TABLET | Freq: Every day | ORAL | Status: DC
  Administered 2020-03-27 – 2020-03-30 (×4): 325 mg via ORAL
  Filled 2020-03-26 (×4): qty 1

## 2020-03-26 MED ORDER — SODIUM CHLORIDE 0.9% FLUSH: 3.0000 mL | INTRAVENOUS | Status: DC | PRN

## 2020-03-26 MED ORDER — POTASSIUM CHLORIDE 10 MEQ/50ML IV SOLN
10.0000 meq | INTRAVENOUS | Status: AC
  Administered 2020-03-26 (×3): 10 meq via INTRAVENOUS

## 2020-03-26 MED ORDER — METOPROLOL TARTRATE 12.5 MG HALF TABLET
12.5000 mg | ORAL_TABLET | Freq: Two times a day (BID) | ORAL | Status: DC
  Administered 2020-03-27 (×2): 12.5 mg via ORAL
  Filled 2020-03-26 (×2): qty 1

## 2020-03-26 MED ORDER — SODIUM CHLORIDE 0.45 % IV SOLN: INTRAVENOUS | Status: DC | PRN

## 2020-03-26 MED ORDER — MAGNESIUM SULFATE 4 GM/100ML IV SOLN
4.0000 g | Freq: Once | INTRAVENOUS | Status: AC
  Administered 2020-03-26: 4 g via INTRAVENOUS
  Filled 2020-03-26: qty 100

## 2020-03-26 MED ORDER — LACTATED RINGERS IV SOLN: INTRAVENOUS | Status: DC

## 2020-03-26 MED ORDER — PANTOPRAZOLE SODIUM 40 MG PO TBEC
40.0000 mg | DELAYED_RELEASE_TABLET | Freq: Every day | ORAL | Status: DC
  Administered 2020-03-28 – 2020-03-30 (×3): 40 mg via ORAL
  Filled 2020-03-26 (×3): qty 1

## 2020-03-26 MED ORDER — ALBUMIN HUMAN 5 % IV SOLN: INTRAVENOUS | Status: DC | PRN

## 2020-03-26 MED ORDER — PROPOFOL 10 MG/ML IV BOLUS
INTRAVENOUS | Status: DC | PRN
  Administered 2020-03-26: 200 mg via INTRAVENOUS

## 2020-03-26 MED ORDER — CHLORHEXIDINE GLUCONATE 0.12 % MT SOLN
15.0000 mL | OROMUCOSAL | Status: AC
  Administered 2020-03-26: 15 mL via OROMUCOSAL

## 2020-03-26 MED ORDER — HEPARIN SODIUM (PORCINE) 1000 UNIT/ML IJ SOLN
INTRAMUSCULAR | Status: DC | PRN
  Administered 2020-03-26: 40000 [IU] via INTRAVENOUS
  Administered 2020-03-26: 2000 [IU] via INTRAVENOUS

## 2020-03-26 MED ORDER — CALCIUM CHLORIDE 10 % IV SOLN
INTRAVENOUS | Status: AC
  Filled 2020-03-26: qty 10

## 2020-03-26 MED ORDER — DOCUSATE SODIUM 100 MG PO CAPS
200.0000 mg | ORAL_CAPSULE | Freq: Every day | ORAL | Status: DC
  Administered 2020-03-27 – 2020-03-28 (×2): 200 mg via ORAL
  Filled 2020-03-26 (×4): qty 2

## 2020-03-26 MED ORDER — SODIUM CHLORIDE (PF) 0.9 % IJ SOLN
OROMUCOSAL | Status: DC | PRN
  Administered 2020-03-26 (×3): 4 mL via TOPICAL

## 2020-03-26 MED ORDER — METOPROLOL TARTRATE 5 MG/5ML IV SOLN: 2.5000 mg | INTRAVENOUS | Status: DC | PRN

## 2020-03-26 MED ORDER — DEXTROSE 50 % IV SOLN
0.0000 mL | INTRAVENOUS | Status: DC | PRN
  Administered 2020-03-26: 12.5 mL via INTRAVENOUS
  Filled 2020-03-26: qty 50

## 2020-03-26 MED ORDER — MIDAZOLAM HCL (PF) 10 MG/2ML IJ SOLN
INTRAMUSCULAR | Status: AC
  Filled 2020-03-26: qty 2

## 2020-03-26 MED ORDER — PROPOFOL 10 MG/ML IV BOLUS
INTRAVENOUS | Status: AC
  Filled 2020-03-26: qty 20

## 2020-03-26 MED ORDER — ALBUMIN HUMAN 5 % IV SOLN
250.0000 mL | INTRAVENOUS | Status: AC | PRN
  Administered 2020-03-26 (×2): 12.5 g via INTRAVENOUS

## 2020-03-26 MED ORDER — SODIUM CHLORIDE 0.9 % IV SOLN
1.5000 g | Freq: Two times a day (BID) | INTRAVENOUS | Status: AC
  Administered 2020-03-26 – 2020-03-28 (×4): 1.5 g via INTRAVENOUS
  Filled 2020-03-26 (×4): qty 1.5

## 2020-03-26 MED ORDER — METOPROLOL TARTRATE 25 MG/10 ML ORAL SUSPENSION: 12.5000 mg | Freq: Two times a day (BID) | ORAL | Status: DC

## 2020-03-26 MED ORDER — SODIUM CHLORIDE 0.9% FLUSH
3.0000 mL | Freq: Two times a day (BID) | INTRAVENOUS | Status: DC
  Administered 2020-03-27 – 2020-03-28 (×3): 3 mL via INTRAVENOUS

## 2020-03-26 MED ORDER — WHITE PETROLATUM EX OINT
TOPICAL_OINTMENT | CUTANEOUS | Status: AC
  Filled 2020-03-26: qty 28.35

## 2020-03-26 MED ORDER — SODIUM CHLORIDE 0.9 % IV SOLN: 250.0000 mL | INTRAVENOUS | Status: DC

## 2020-03-26 MED ORDER — SUCCINYLCHOLINE CHLORIDE 20 MG/ML IJ SOLN
INTRAMUSCULAR | Status: DC | PRN
  Administered 2020-03-26: 120 mg via INTRAVENOUS

## 2020-03-26 MED ORDER — SODIUM CHLORIDE 0.9% FLUSH: 10.0000 mL | INTRAVENOUS | Status: DC | PRN

## 2020-03-26 MED ORDER — FAMOTIDINE IN NACL 20-0.9 MG/50ML-% IV SOLN
20.0000 mg | Freq: Two times a day (BID) | INTRAVENOUS | Status: AC
  Administered 2020-03-26 (×2): 20 mg via INTRAVENOUS
  Filled 2020-03-26 (×2): qty 50

## 2020-03-26 MED ORDER — PHENYLEPHRINE HCL (PRESSORS) 10 MG/ML IV SOLN
INTRAVENOUS | Status: AC
  Filled 2020-03-26: qty 2

## 2020-03-26 MED ORDER — HEMOSTATIC AGENTS (NO CHARGE) OPTIME
TOPICAL | Status: DC | PRN
  Administered 2020-03-26: 1 via TOPICAL

## 2020-03-26 MED ORDER — ACETAMINOPHEN 500 MG PO TABS
1000.0000 mg | ORAL_TABLET | Freq: Four times a day (QID) | ORAL | Status: DC
  Administered 2020-03-27 – 2020-03-30 (×12): 1000 mg via ORAL
  Filled 2020-03-26 (×13): qty 2

## 2020-03-26 MED ORDER — ROCURONIUM BROMIDE 10 MG/ML (PF) SYRINGE
PREFILLED_SYRINGE | INTRAVENOUS | Status: AC
  Filled 2020-03-26: qty 30

## 2020-03-26 MED ORDER — PLASMA-LYTE 148 IV SOLN
INTRAVENOUS | Status: DC | PRN
  Administered 2020-03-26: 500 mL via INTRAVASCULAR

## 2020-03-26 MED ORDER — MIDAZOLAM HCL 2 MG/2ML IJ SOLN
2.0000 mg | INTRAMUSCULAR | Status: DC | PRN
  Administered 2020-03-26: 1 mg via INTRAVENOUS
  Administered 2020-03-27: 2 mg via INTRAVENOUS
  Filled 2020-03-26 (×2): qty 2

## 2020-03-26 MED ORDER — ACETAMINOPHEN 160 MG/5ML PO SOLN: 650.0000 mg | Freq: Once | ORAL | Status: AC

## 2020-03-26 MED ORDER — ASPIRIN 81 MG PO CHEW: 324.0000 mg | CHEWABLE_TABLET | Freq: Every day | ORAL | Status: DC

## 2020-03-26 MED ORDER — FENTANYL CITRATE (PF) 100 MCG/2ML IJ SOLN
50.0000 ug | INTRAMUSCULAR | Status: DC | PRN
  Administered 2020-03-26: 100 ug via INTRAVENOUS
  Administered 2020-03-26: 50 ug via INTRAVENOUS
  Filled 2020-03-26 (×2): qty 2

## 2020-03-26 MED ORDER — ACETAMINOPHEN 650 MG RE SUPP
650.0000 mg | Freq: Once | RECTAL | Status: AC
  Administered 2020-03-26: 650 mg via RECTAL

## 2020-03-26 MED ORDER — LACTATED RINGERS IV SOLN: INTRAVENOUS | Status: DC | PRN

## 2020-03-26 MED ORDER — VANCOMYCIN HCL IN DEXTROSE 1-5 GM/200ML-% IV SOLN
1000.0000 mg | Freq: Once | INTRAVENOUS | Status: AC
  Administered 2020-03-26: 1000 mg via INTRAVENOUS
  Filled 2020-03-26: qty 200

## 2020-03-26 MED ORDER — PHENYLEPHRINE HCL-NACL 10-0.9 MG/250ML-% IV SOLN
INTRAVENOUS | Status: DC | PRN
  Administered 2020-03-26: 25 ug/min via INTRAVENOUS

## 2020-03-26 SURGICAL SUPPLY — 86 items
BAG DECANTER FOR FLEXI CONT (MISCELLANEOUS) ×5 IMPLANT
BLADE CLIPPER SURG (BLADE) ×5 IMPLANT
BLADE STERNUM SYSTEM 6 (BLADE) ×5 IMPLANT
BLADE SURG 11 STRL SS (BLADE) ×2 IMPLANT
BLADE SURG 15 STRL LF DISP TIS (BLADE) IMPLANT
BLADE SURG 15 STRL SS (BLADE) ×5
BNDG ELASTIC 4X5.8 VLCR STR LF (GAUZE/BANDAGES/DRESSINGS) ×7 IMPLANT
BNDG ELASTIC 6X5.8 VLCR STR LF (GAUZE/BANDAGES/DRESSINGS) ×5 IMPLANT
BNDG GAUZE ELAST 4 BULKY (GAUZE/BANDAGES/DRESSINGS) ×7 IMPLANT
CANISTER SUCT 3000ML PPV (MISCELLANEOUS) ×5 IMPLANT
CANNULA EZ GLIDE AORTIC 21FR (CANNULA) ×5 IMPLANT
CATH CPB KIT HENDRICKSON (MISCELLANEOUS) ×5 IMPLANT
CATH ROBINSON RED A/P 18FR (CATHETERS) ×5 IMPLANT
CATH THORACIC 36FR (CATHETERS) ×5 IMPLANT
CATH THORACIC 36FR RT ANG (CATHETERS) ×5 IMPLANT
CLIP FOGARTY SPRING 6M (CLIP) ×2 IMPLANT
CLIP VESOCCLUDE SM WIDE 24/CT (CLIP) ×4 IMPLANT
COVER MAYO STAND STRL (DRAPES) ×2 IMPLANT
DERMABOND ADVANCED (GAUZE/BANDAGES/DRESSINGS) ×2
DERMABOND ADVANCED .7 DNX12 (GAUZE/BANDAGES/DRESSINGS) IMPLANT
DRAPE CARDIOVASCULAR INCISE (DRAPES) ×5
DRAPE EXTREMITY T 121X128X90 (DISPOSABLE) ×5 IMPLANT
DRAPE HALF SHEET 40X57 (DRAPES) ×2 IMPLANT
DRAPE SLUSH/WARMER DISC (DRAPES) ×5 IMPLANT
DRAPE SRG 135X102X78XABS (DRAPES) ×3 IMPLANT
DRSG COVADERM 4X14 (GAUZE/BANDAGES/DRESSINGS) ×5 IMPLANT
ELECT REM PT RETURN 9FT ADLT (ELECTROSURGICAL) ×10
ELECTRODE REM PT RTRN 9FT ADLT (ELECTROSURGICAL) ×6 IMPLANT
FELT TEFLON 1X6 (MISCELLANEOUS) ×8 IMPLANT
GAUZE SPONGE 4X4 12PLY STRL (GAUZE/BANDAGES/DRESSINGS) ×10 IMPLANT
GAUZE SPONGE 4X4 12PLY STRL LF (GAUZE/BANDAGES/DRESSINGS) ×2 IMPLANT
GLOVE BIO SURGEON STRL SZ 6.5 (GLOVE) ×10 IMPLANT
GLOVE BIO SURGEON STRL SZ7.5 (GLOVE) ×4 IMPLANT
GLOVE BIO SURGEONS STRL SZ 6.5 (GLOVE) ×10
GLOVE BIOGEL PI IND STRL 6.5 (GLOVE) IMPLANT
GLOVE BIOGEL PI INDICATOR 6.5 (GLOVE) ×6
GLOVE SURG SIGNA 7.5 PF LTX (GLOVE) ×15 IMPLANT
GOWN STRL REUS W/ TWL LRG LVL3 (GOWN DISPOSABLE) ×12 IMPLANT
GOWN STRL REUS W/ TWL XL LVL3 (GOWN DISPOSABLE) ×6 IMPLANT
GOWN STRL REUS W/TWL LRG LVL3 (GOWN DISPOSABLE) ×40
GOWN STRL REUS W/TWL XL LVL3 (GOWN DISPOSABLE) ×25
HEMOSTAT POWDER SURGIFOAM 1G (HEMOSTASIS) ×15 IMPLANT
HEMOSTAT SURGICEL 2X14 (HEMOSTASIS) ×5 IMPLANT
INSERT FOGARTY XLG (MISCELLANEOUS) ×2 IMPLANT
KIT BASIN OR (CUSTOM PROCEDURE TRAY) ×5 IMPLANT
KIT SUCTION CATH 14FR (SUCTIONS) ×10 IMPLANT
KIT TURNOVER KIT B (KITS) ×5 IMPLANT
KIT VASOVIEW HEMOPRO 2 VH 4000 (KITS) ×5 IMPLANT
MARKER GRAFT CORONARY BYPASS (MISCELLANEOUS) ×15 IMPLANT
NS IRRIG 1000ML POUR BTL (IV SOLUTION) ×25 IMPLANT
PACK E OPEN HEART (SUTURE) ×5 IMPLANT
PACK OPEN HEART (CUSTOM PROCEDURE TRAY) ×5 IMPLANT
PAD ARMBOARD 7.5X6 YLW CONV (MISCELLANEOUS) ×10 IMPLANT
PAD ELECT DEFIB RADIOL ZOLL (MISCELLANEOUS) ×5 IMPLANT
PENCIL BUTTON HOLSTER BLD 10FT (ELECTRODE) ×5 IMPLANT
POSITIONER HEAD DONUT 9IN (MISCELLANEOUS) ×5 IMPLANT
PUNCH AORTIC ROTATE 4.0MM (MISCELLANEOUS) ×2 IMPLANT
SET CARDIOPLEGIA MPS 5001102 (MISCELLANEOUS) ×2 IMPLANT
SHEARS HARMONIC 9CM CVD (BLADE) ×5 IMPLANT
SPONGE LAP 4X18 RFD (DISPOSABLE) ×2 IMPLANT
SUPPORT HEART JANKE-BARRON (MISCELLANEOUS) ×5 IMPLANT
SUT BONE WAX W31G (SUTURE) ×5 IMPLANT
SUT MNCRL AB 3-0 PS2 18 (SUTURE) ×2 IMPLANT
SUT PROLENE 3 0 SH DA (SUTURE) ×5 IMPLANT
SUT PROLENE 4 0 RB 1 (SUTURE) ×10
SUT PROLENE 4-0 RB1 .5 CRCL 36 (SUTURE) IMPLANT
SUT PROLENE 6 0 C 1 30 (SUTURE) ×12 IMPLANT
SUT PROLENE 7 0 BV1 MDA (SUTURE) ×7 IMPLANT
SUT PROLENE 8 0 BV175 6 (SUTURE) ×4 IMPLANT
SUT STEEL 6MS V (SUTURE) ×5 IMPLANT
SUT STEEL STERNAL CCS#1 18IN (SUTURE) IMPLANT
SUT STEEL SZ 6 DBL 3X14 BALL (SUTURE) ×5 IMPLANT
SUT VIC AB 1 CTX 36 (SUTURE) ×10
SUT VIC AB 1 CTX36XBRD ANBCTR (SUTURE) ×6 IMPLANT
SUT VIC AB 2-0 CT1 27 (SUTURE) ×15
SUT VIC AB 2-0 CT1 TAPERPNT 27 (SUTURE) IMPLANT
SUT VIC AB 3-0 X1 27 (SUTURE) ×2 IMPLANT
SYSTEM SAHARA CHEST DRAIN ATS (WOUND CARE) ×5 IMPLANT
TAPE CLOTH SOFT 2X10 (GAUZE/BANDAGES/DRESSINGS) ×2 IMPLANT
TAPE CLOTH SURG 4X10 WHT LF (GAUZE/BANDAGES/DRESSINGS) ×2 IMPLANT
TOWEL GREEN STERILE (TOWEL DISPOSABLE) ×5 IMPLANT
TOWEL GREEN STERILE FF (TOWEL DISPOSABLE) ×5 IMPLANT
TRAY FOLEY SLVR 16FR TEMP STAT (SET/KITS/TRAYS/PACK) ×5 IMPLANT
TUBING LAP HI FLOW INSUFFLATIO (TUBING) ×5 IMPLANT
UNDERPAD 30X36 HEAVY ABSORB (UNDERPADS AND DIAPERS) ×7 IMPLANT
WATER STERILE IRR 1000ML POUR (IV SOLUTION) ×10 IMPLANT

## 2020-03-26 NOTE — Hospital Course (Addendum)
HPI: This is a 44 yo man with multiple CRf including hypertension, tobacco abuse and family history of CAD presents with accelerating angina. First had CP with exertion about a year ago. Would only occur with heavy exertion. Over the past month has had increasing frequency and severity with progressively less exertion. Coronary CT showed significant disease in the LAD, circumflex and RCA. Today he underwent cath which revealed severe 3 vessel CAD, EF was 55-60%. Dr. Dorris Fetch discussed the general nature of the procedure, including the need for general anesthesia, the incisions to be used, the use of cardiopulmonary bypass, and the use of drainage tubes postop with Mr. Ponzo.  We discussed the expected hospital stay, overall recovery and short and long term outcomes. Dr. Dorris Fetch informed him of the indications, risks, benefits and alternatives. Pre operative carotid duplex US showed no significant internal carotid artery stenosis bilaterally. Patient underwent a CABG x 4 on 03/26/2020.  Hospital Course: Patient was extubated early afternoon after surgery. He was weaned off of Nitro and Neo Synephrine drips. He remained afebrile and hemodynamically stable. He was initially AAI paced at 90. He was started on Imdur. He had mild thrombocytopenia. This did resolve as his last platelet count was up to ***. He was volume overloaded and diuresed accordingly. He was weaned off the Insulin drip. His pre op HGA1C was 5.6. He will need further surveillance of HGA1C with medical doctor after discharge. He was started on Toprol XL as he no longer was paced. He was felt surgically stable for transfer from the ICU to 4E for further convalescence on 11/24. He was requiring 2-3 liters of oxygen via [place] but was later weaned to room air. He has been tolerating a diet and has had a bowel movement. His epicardial pacing wires were removed on 11/**. His wounds are clean, dry, and healing without signs of infection.

## 2020-03-26 NOTE — Discharge Instructions (Signed)

## 2020-03-26 NOTE — Progress Notes (Signed)
RT placed ETT holder on pt. Due to pink tape coming off.

## 2020-03-26 NOTE — Progress Notes (Signed)
Previous CBGs obtained from [employer]. cbg of 200 obtained via finger stick. Will monitor CBGs via finger stick.

## 2020-03-26 NOTE — Progress Notes (Signed)
Patient requesting to speak to someone regarding dealing with family stress. States he feels like he needs to see a Veterinary surgeon.

## 2020-03-26 NOTE — Anesthesia Procedure Notes (Signed)
Procedure Name: Intubation Date/Time: 03/26/2020 7:53 AM Performed by: Marena Chancy, CRNA Pre-anesthesia Checklist: Patient identified, Emergency Drugs available, Suction available and Patient being monitored Patient Re-evaluated:Patient Re-evaluated prior to induction Oxygen Delivery Method: Circle System Utilized Preoxygenation: Pre-oxygenation with 100% oxygen Induction Type: IV induction Ventilation: Oral airway inserted - appropriate to patient size, Mask ventilation with difficulty and Mask ventilation throughout procedure Laryngoscope Size: Miller and 2 Grade View: Grade II Tube type: Oral Tube size: 8.0 mm Number of attempts: 1 Airway Equipment and Method: Stylet and Oral airway Placement Confirmation: ETT inserted through vocal cords under direct vision,  positive ETCO2 and breath sounds checked- equal and bilateral Tube secured with: Tape Dental Injury: Teeth and Oropharynx as per pre-operative assessment

## 2020-03-26 NOTE — Progress Notes (Signed)
Patient ID: Marc Johnston, male   DOB: 10-15-1975, 44 y.o.   MRN: [id number] EVENING ROUNDS NOTE :     [address]       [name] [address]             [phone]                 Day of Surgery Procedure(s) (LRB): CORONARY ARTERY BYPASS GRAFTING (CABG) x 4 , using left internal mammary artery, open left radial artery harvest, and left greater saphenous vein harvested endoscopically.  LIMA TO LAD, SVG TO PD, SVG TO DIAG. RADIAL TO OM (N/A) OPEN RADIAL ARTERY HARVEST (N/A) TRANSESOPHAGEAL ECHOCARDIOGRAM (TEE) (N/A) ENDOVEIN HARVEST OF GREATER SAPHENOUS VEIN (Left)  Total Length of Stay:  LOS: 3 days  BP 115/73   Pulse 89   Temp 97.7 F (36.5 C)   Resp (!) 21   Ht 5\' 10"  (1.778 m)   Wt 112.8 kg   SpO2 97%   BMI 35.67 kg/m   .Intake/Output      11/21 0701 - 11/22 0700 11/22 0701 - 11/23 0700   P.O. 240    I.V. (mL/kg) 6 (0.1) 2775.3 (24.6)   Blood  440   IV Piggyback  1655.3   Total Intake(mL/kg) 246 (2.2) 4870.6 (43.2)   Urine (mL/kg/hr)  2075 (2)   Blood  293   Chest Tube  150   Total Output  2518   Net +246 +2352.6          . sodium chloride Stopped (03/26/20 1538)  . [START ON 03/27/2020] sodium chloride    . sodium chloride    . albumin human Stopped (03/26/20 1530)  . cefUROXime (ZINACEF)  IV    . dexmedetomidine (PRECEDEX) IV infusion Stopped (03/26/20 1521)  . famotidine (PEPCID) IV Stopped (03/26/20 1434)  . insulin 1 mL/hr at 03/26/20 1600  . lactated ringers    . lactated ringers    . lactated ringers 20 mL/hr at 03/26/20 1600  . magnesium sulfate 20 mL/hr at 03/26/20 1600  . nitroGLYCERIN 7 mcg/min (03/26/20 1600)  . phenylephrine (NEO-SYNEPHRINE) Adult infusion 30 mcg/min (03/26/20 1600)  . potassium chloride 10 mEq (03/26/20 1538)  . vancomycin       Lab Results  Component Value Date   WBC 16.2 (H) 03/26/2020   HGB 12.5 (L) 03/26/2020   HCT 37.6 (L) 03/26/2020   PLT 95 (L) 03/26/2020   GLUCOSE 109 (H) 03/26/2020   CHOL  118 03/24/2020   TRIG 155 (H) 03/24/2020   HDL 29 (L) 03/24/2020   LDLCALC 58 03/24/2020   ALT 35 03/24/2020   AST 22 03/24/2020   NA 141 03/26/2020   K 4.6 03/26/2020   CL 104 03/26/2020   CREATININE 0.40 (L) 03/26/2020   BUN 8 03/26/2020   CO2 25 03/24/2020   TSH 1.276 07/31/2014   INR 1.6 (H) 03/26/2020   HGBA1C 5.6 03/24/2020   Now extubated  Not bleeding On ntg and neo Stable early postop   03/26/2020 MD  Beeper [phone] Office [phone] 03/26/2020 4:22 PM

## 2020-03-26 NOTE — Progress Notes (Signed)
Pt viewed CABG videos prior to HS, questions answered.

## 2020-03-26 NOTE — Anesthesia Postprocedure Evaluation (Signed)
Anesthesia Post Note  Patient: Marc Johnston  Procedure(s) Performed: CORONARY ARTERY BYPASS GRAFTING (CABG) x 4 , using left internal mammary artery, open left radial artery harvest, and left greater saphenous vein harvested endoscopically.  LIMA TO LAD, SVG TO PD, SVG TO DIAG. RADIAL TO OM (N/A Chest) OPEN RADIAL ARTERY HARVEST (N/A ) TRANSESOPHAGEAL ECHOCARDIOGRAM (TEE) (N/A ) ENDOVEIN HARVEST OF GREATER SAPHENOUS VEIN (Left )     Patient location during evaluation: SICU Anesthesia Type: General Level of consciousness: sedated Pain management: pain level controlled Vital Signs Assessment: post-procedure vital signs reviewed and stable Respiratory status: patient remains intubated per anesthesia plan Cardiovascular status: stable Postop Assessment: no apparent nausea or vomiting Anesthetic complications: no   No complications documented.  Last Vitals:  Vitals:   03/26/20 0557 03/26/20 1333  BP:    Pulse: 72   Resp:    Temp:    SpO2:  100%    Last Pain:  Vitals:   03/26/20 0554  TempSrc: Oral  PainSc:                  Mandi Mattioli DANIEL

## 2020-03-26 NOTE — Research (Addendum)
[facility] Informed Consent     Subject Name: Marc Johnston    Subject: [id number]  Subject met inclusion and exclusion criteria.  The informed consent form, study requirements and expectations were reviewed with the subject and questions and concerns were addressed prior to the signing of the consent form.  The subject verbalized understanding of the trial requirements.  The subject agreed to participate in the [employer] trial and signed the informed consent at 1300 on 03/23/2020.  The informed consent was obtained prior to performance of any protocol-specific procedures for the subject.  A copy of the signed informed consent was given to the subject and a copy was placed in the subject's medical record.   Jaquelyn Sakamoto  Inclusion: _0  1. Age >18 _1  2. Clinically stable patient with known CAD. _2   3. CCTA showing stenosis in at least one major epicardial vessel of stentable/graftable diameter, in whom clinically indicated IVUS is planned within 45 days of the CCTA  and FFR CT available. _3  4. FFR CT successfully processed. _4  5. Willing to comply with protocol. _5  6. Agrees to be included in the study and able to provide written informed consent.  Exclusion: _6  1. CCTA showing no stenosis. _7  2. Uninterpretable CCTA by HeartFlow assessment, in which image quality prevents FFR CT from being processed. _8  3. Acute chest pain. _9  4. CABG prior to CCTA acquisition. _10  5. Prior history of PCI for 3 or more vessels. _11  6. MI less that 30 days prior to CCTA or between CCTA and ICA. _12  7. Suspicion of acute coronary syndrome, Acute MI or Unstable Angina. _13  8. Known complex congenital heart disease. _14  9. Tachycardia or significant arrythmia. _15  10. Subject requires an emergent procedure. _16  11. Evidence of ongoing or active clinical instability, including acute chest pain  (sudden onset), cardiogenic shock, unstable blood pressure with systolic blood pressure <24  mmHg, and severe congestive heart failure (NYHA lll or lV) or acute  pulmonary edema.  _17  12. Any active, serious, life-threatening disease with a life expectancy of less than 2 months. _18  13. Currently enrolled in another study utilizing FFR CT or in an investigational trial that involves a non-approved cardiac drug or device.  _19  14. Persons under the protection of justice, guardianship, or curatorship.   Reason for CCTA scan: _20  Chest Pain _21  Asymptomatic/screening     _22  Prior MI     _23  Dyspnea   _24  Palpitations _25  Abnormal ECG    _26    Abnormal Stress or Perfusion   _27  Other Which CT Scanner was used?          _28  Philips Model: _29  CT 5000 Ingenuity    _30  Spectral CT 7500   _31  CT6000 iCT    _32  IQon         _33   Incisive CT    _34   Other _35  Siemens Model: _36  Somatom Definition Edge    _37  Somatom Definition Flash            _38  Somatom Force  _39  Somatom Drive   <MWNUUVOZDGUYQIHK>_7<\/QQVZDGLOVFIEPPIR>_51   Other _41  Product/process development scientist:  _42  Revolution CT  _43  CardioGraph   _44  Optima CT660  _45   Discovery HD 750   _46  Other Radiation Exposure for CCTA Only (Sum for All Coronary Series)  CT dose index (CTDI) (mGy)  ______94.90_______________  Dose-length product (DLP) (mGy-cm) ____1224.2___________   _47   CCTA Image Uploaded   _48  CCTA report uploaded  _49  All subject information redacted from  image and report? Subject Demographics: Age: __44___    Year of birth:  _1977_____ Birth Sex: _0  Male   _1   Male  Weight: 112.8___ _2 lb  _3  kg   _4  Not done Height: _177.8____ _5  in  _6  cm   _7  Not done  Ethnicity: _8  Not of Hispanic, Latino/a, or Spanish origin _9  Hispanic, Latino/a, or Spanish origin Specify: _10   [place] American, Chicano/a      _11  [place]  _12  [place] _13 other Hispanic, Latino/a, or Spanish origin Race:  _14  White  _15  Black  _16  American [place] or [place] Native  _17  Asian:  Specify: _18  Asian [place]   _19  [place]  _20  [place]  _21  [place]   _22  Filipino  _23  Other Asian  _24  [place]   _25  Native Hawaiian  or other Pacific Islander              _26  Native Hawaiian   _27  Guamanian or Chamorro  _28  Samoan   _29  Other Pacific Islander   _30  Other (specify) Medical History: Angina within the last 45 days:   _31  Yes   _32  No If yes: _33  Typical _34  Atypical  _35  Dyspnea  _36  Noncardiac pain Angina type: _37  Stable _38  Unstable _39  Silent Ischemia  _40  Unknown _41  Other Congestive Heart Failure: _42  Yes _43  No Diabetes: _44  Yes  _45  No If yes:  _46  Type l   _47  Type ll Controlled by:  _48  Insulin   _49  Oral hypoglycemic  _50  Diet  _51   unknown Hypertension:  _52  Yes  <JYNWGNFAOZHYQMVH>_8<\/IONGEXBMWUXLKGMW>_10   No  (Systolic >272, diastolic >53 or req. medication) Is subject taking anti-hypertensive medication?   _54  Yes  _55   No Hyperlipidemia:   _56  Yes  _57  No Is subject taking anti-hyperlipidemic medication?    _58  Yes  _59  No Tobacco Use:  _60  Former  _61  Current  _62   Non-smoker  _63   Unknown Any nicotine use in 24 hours prior to CCTA?   _64  Yes  _65   No Family history of premature atherosclerotic disease?   _66  Yes  _67   No (Coronary artery disease, cerebrovascular disease, or peripheral vascular disease for male 2o relatives < 20 and/or male 2o relatives < 76 years old) Stroke:  _68  Yes  _69  No Transient Ischemic Attack (TIA):   _70  Yes  _71  No Peripheral Vascular Disease:  _72  Yes  _73  No Sedentary Lifestyle:    _74  Yes   _75  No Other Relevant Disease or Comorbidity:    _76  Yes  _77  No Specify: ___________________   PCI History: Does the subject have previously stented vessels?   _78  Yes  _79  No ______________ Total number of PCI procedures ______________ Date of most recent PCI ______________ Total number of stented vessels NOTE: CASS Segment Diagram is provided for reference in Appendix A. PCI History Records: (complete a record for each stent) Stent 1:           Vessel: _80  Right coronary artery  _81  Left main artery  _82  Left circumflex artery _83  Left anterior descending artery   _84  Other________________ CASS Segment for starting location of stent:  ______________________________ CASS Segment for ending location of stent: _______________________________ Stent manufacturer: _________________________________________________ Diameter of stent (mm): _____________   Length of stent (mm): _____________ Stent 2:             Vessel: _85  Right coronary artery  _86  Left main artery  _87  Left circumflex artery _88  Left anterior descending artery   _89  Other________________ CASS Segment for starting  location of stent: ______________________________ CASS Segment for ending location of stent: _______________________________ Stent manufacturer: _________________________________________________ Diameter of stent (mm): _____________   Length of stent (mm): _____________ Coronary Tests: Any coronary tests within 90 days prior to enrollment?  _0  Yes  _1  No Invasive Coronary Angiography (ICA):    _2  Yes _3  No Most recent test date: _________________   Result: _4  Positive  _5  Negative  _6  Intermediate  _7  Unknown _8  Image uploaded       Report: _9  Uploaded  _10  N/A _11  All subject information redacted from images and reports? Exercise Tolerance Test (ETT):  _12 Yes  _13  No     Most recent test date: Click or tap to enter a date. Result:   _14  Positive    _15  Negative  _16  Intermediate  _17   Unknown _18  Image uploaded    Report:  _19  Uploaded  _20  N/A  _21   All subject information redacted from images and reports? Stress Echocardiogram: _22  Yes  _23  No   Most recent date:Click or tap to enter a date. Result:  _24  Positive  _25  Negative  _26  Intermediate  _27  Unknown _28 Image uploaded    Report:  _29  Uploaded  _30  N/A      _31  All images redacted? Nuclear Myocardial Perfusion Scan:    _32  Yes  _33  No Most recent test date: Click or tap to enter a date.  Result:   _34  Positive    _35  Negative  _36  Intermediate  _37   Unknown _38 Image uploaded    Report:  _39  Uploaded  _40  N/A      _41  All images redacted? Cardiac MRI:   _42  Yes  _43  No Most recent test date: Click or tap to enter a  date.  Result:   _44  Positive    _45  Negative  _46  Intermediate  _47   Unknown _48 Image uploaded    Report:  _49  Uploaded  _50  N/A      _51  All images redacted? Cardiac PET:    _52  Yes  _53  No Most recent test date: Click or tap to enter a date.  Result:   _54  Positive    _55  Negative  _56  Intermediate  _57   Unknown _58 Image uploaded    Report:  _59  Uploaded  _60  N/A      _61  All images redacted?  Baseline Cath Procedure: Date of procedure: Click or tap to enter a date. Blood pressure prior to procedure:   Heart Rate prior to procedure: Time of arterial access: Time of first lesion treatment: Time last guide catheter removed: _______________ Aortic pressure (mean mmHg): __________________ LVED pressure (mmHg): ________________________ Arterial access site:   _62  Radial  _63  Femoral  _64  Brachial Maximum arterial sheath size (Fr) _______________ Venous access needle size:  _65  18 G  _66  20 G  _67  4 FR  _68  5 FR  _69  6 FR  _70  Other Left ventriculography performed?  _71  Yes  _72  No If yes, when:   _73  Pre-intervention  _74  Post- intervention LVEF (%):_______________ Were there any procedural complications?          _75  Yes  _76  No _77  Dissection from wire  _78  Dissection from catheter  _79  Slow / no flow _80  Allergic reaction to medication  _81  Side branch occlusion  _82  Other  Was the FFRCT model available & used in cath lab during the procedure? _83  Y  _84  N _85  Angio image uploaded  _86  Cath report uploaded  _87  All subject information redacted from images and reports?  Intraprocedural Medications: Was adenosine administered within 24 hrs of the cath procedure?  _0  Yes  _1  No IV Adenosine start time: _______________ Adenosine dose:  _2  140 ug/kg/min  _3  Other dose  _4  None Was nitroglycerin administered within 24 hrs of the cath procedure? _5  Yes _6  No NTG dose: _____________ NTG Route: _7  IV  _8  IC  _9  Other  Intraprocedural Devices: (total number of each device used during the procedure): _________ Pressure wires & FFR  wires _________ Guidewires   (Do not include wires used before intervention and pressure/FFR wires) _________ Balloon catheters _________ Drug eluting stents _________ Bare metal stents _________ Guide catheters _________ Intravascular ultrasound catheters _________ Intravascular ultrasound OCT catheters _________ Thrombectomy and atherectomy catheters _________ Temporary pacemakers _________ Intra-aortic balloon pumps (IABP) Intraprocedural Radiation exposure: Total fluoroscopy time _________________ (minutes) Absorbed Dose of Radiation _____________   _10  mGy  _11  Gy Dose Area Product: ____________________  _12  Gy*cm2  _13  cGy*cm2  _14  mGy*cm2  Other: ______________ Contrast Use: (check all that apply) _15  Ominipaque  _16  Visipaque  _17   Isovue  _18  Iomeron  _19  Ultravist   _20 Oxilan _21  Hypaque  _22  Isopaque  _23  Hexibrix  _24  OptiRay  _25  Iopamidol  Total amount of contrast used (mL): ______________  IVUS/OCT: Model: _26  Philips Volcano                    Catheter type:    _27  Revolution  _28  Eagle Eye Platinum  _29  Eagle Eye  P     Platinum ST          _30  Refinity ST               _31  [employer]                   Catheter type:  _32  Opticross   _33  Opticross HD  _34  Other                _35  Terumo                    Catheter type:   _36  ViewIT    _37  Intrafocus WR  Ivus Image uploaded:  _38  Yes  _39  No OCT taken?   _40  Yes   _41  No   _42  All subject information redacted from images and reports?  IVUS/OCT Records Was automatic pullback used for IVUS/OCT acquisition?   _43  Yes  _44  No       Pullback speed (mm/sec): _____________________        Length of pullback (mm): _____________________ Vessel 1: Image type:   _45  IVUS   _46  OCT  _47  Right coronary artery  _48  Left main artery  _49  Left circumflex artery _50  Left anterior descending artery   _51  Other________________ CASS Segment for starting location of guidewire: __________________________ CASS Segment for ending location of guidewire:  ________________________ Guide catheter size:  _52 4 FR  _53 5 FR  _54 6 FR  _55 7 FR  _56 8 FR Vessel 2: Image type:   _57  IVUS   _58  OCT  _59  Right coronary artery  _60  Left main artery  _61  Left circumflex artery _62  Left anterior descending artery   _63  Other________________ CASS Segment for starting location of guidewire: __________________________ CASS Segment for ending location of guidewire: ________________________ Guide catheter size:  _64 4 FR  _65 5 FR  _66 6 FR  _67 7 FR  _68 8 FR Vessel 3: Image type:   _69  IVUS   _70  OCT  _71  Right coronary artery  _72   Left main artery  _0  Left circumflex artery _1  Left anterior descending artery   _2  Other________________ CASS Segment for starting location of guidewire: __________________________ CASS Segment for ending location of guidewire: ________________________ Guide catheter size:  _3 4 FR  _4 5 FR  _5 6 FR  _6 7 FR  _7 8 FR FFR/NHPR Records Measurement taken during cath procedure: _8  FFR  _9  DFR  _10  IFR  _11  RFR   _12 Other Vessel 1: _13  Right coronary artery  _14  Left main artery  _15  Left circumflex artery _16  Left anterior descending artery   _17  Other________________ Contrast used?     _18  Yes   _19  No CASS Segment for starting location of guide wire: __________________________ CASS Segment for ending location of guide wire: ___________________________ Guide catheter size:  _20 4 FR  _21 5 FR  _22 6 FR  _23 7 FR  _24 8 FR FFR Value: _____________ Pd value: ______________ Pa value: ______________ NHPR value: ____________ Vessel 2: _25  Right coronary artery  _26  Left main artery  _27  Left circumflex artery _28  Left anterior descending artery   _29  Other________________ Contrast used?     _30  Yes   _31  No CASS Segment for starting location of guide wire: __________________________ CASS Segment for ending location of guide wire: ___________________________ Guide catheter size:  _32 4 FR  _33 5 FR  _34 6 FR  _35 7 FR  _36 8 FR FFR Value: _____________ Pd value: ______________ Pa value:  ______________ NHPR value: ____________  Vessel 3: _37  Right coronary artery  _38  Left main artery  _39  Left circumflex artery _40  Left anterior descending artery   _41  Other________________ Contrast used?     _42  Yes   _43  No CASS Segment for starting location of guide wire: __________________________ CASS Segment for ending location of guide wire: ___________________________ Guide catheter size:  _44 4 FR  _45 5 FR  _46 6 FR  _47 7 FR  _48 8 FR FFR Value: _____________ Pd value: ______________ Pa value: ______________ NHPR value: ____________ FFR Uploads _49  Angio Image  _50  FFR Report  _51  FFR Tracings  _52  Screenshot of Angio - starting position of FFR guidewire without contrast _53  Screenshot of Angio - starting position of FFR guidewire with contrast  NHPR Uploads _54  Angio Image  _55  NHPR Report  _56  NHPR Tracings  _57  Screenshot of Angio - starting position of NHPR guidewire without contrast _58  Screenshot of Angio - starting position of NHPR guidewire with contrast _59  All subject information redacted from images and reports?  Study completion:  Did subject complete participation in the study?    _60  Yes  _61  No If no, reason:                         _62  Death _63  Withdrew consent  _64  Other  _65  Screen Failure Screen Failure reason:  _66  Ivus not used  _67  Use of balloon prior  _68  Automatic pullback not used

## 2020-03-26 NOTE — Interval H&P Note (Signed)
History and Physical Interval Note:  03/26/2020 7:22 AM  Marc Johnston  has presented today for surgery, with the diagnosis of CAD.  The various methods of treatment have been discussed with the patient and family. After consideration of risks, benefits and other options for treatment, the patient has consented to  Procedure(s): CORONARY ARTERY BYPASS GRAFTING (CABG) (N/A) possible RADIAL ARTERY HARVEST (N/A) TRANSESOPHAGEAL ECHOCARDIOGRAM (TEE) (N/A) as a surgical intervention.  The patient's history has been reviewed, patient examined, no change in status, stable for surgery.  I have reviewed the patient's chart and labs.  Questions were answered to the patient's satisfaction.     [name]

## 2020-03-26 NOTE — Procedures (Signed)
Extubation Procedure Note  Patient Details:   Name: Marc Johnston DOB: 10/02/1975 MRN: [id number]   Airway Documentation:    Vent end date: 03/26/20 Vent end time: 1605   Evaluation  O2 sats: stable throughout Complications: No apparent complications Patient did tolerate procedure well. Bilateral Breath Sounds: Clear, Diminished   Yes   Patient extubated per rapid wean protocol. Positive cuff leak. NIF -25, VC 1.7L. Vitals are stable on 3L [place]. RN at bedside.  Harmon Dun Shameria Trimarco 03/26/2020, 4:15 PM

## 2020-03-26 NOTE — Brief Op Note (Addendum)
03/23/2020 - 03/26/2020  11:43 AM  PATIENT:  Marc Johnston  44 y.o. male  PRE-OPERATIVE DIAGNOSIS:  CAD  POST-OPERATIVE DIAGNOSIS:  CAD  PROCEDURE:  TRANSESOPHAGEAL ECHOCARDIOGRAM (TEE), CORONARY ARTERY BYPASS GRAFTING (CABG) x 4  (LIMA to LAD, SVG to DIAGONAL, LEFT RADIAL ARTERY to OM3, SVG to PDA) using left internal mammary artery, open left radial artery harvest, and left greater saphenous vein harvested endoscopically,   LEFT RADIAL ARTERY HARVEST TIMES: 60 minutes;LEFT RADIAL ARTERY PREP TIME: 5 minutes LEFT EVH HARVEST TIMES: 20 minutes;LEFT EVH PREP TIME: 9 minutes  SURGEON:  Surgeon(s) and Role:    [name], MD - Primary  PHYSICIAN ASSISTANT: 1. Gershon Crane PA-C 2.Doree Fudge PA-C   ASSISTANTS: Babette Relic RNFA  ANESTHESIA:   general  EBL:  Per anesthesia and perfusion record  DRAINS: Chest tubes placed in the mediastinal and pleural spaces   COUNTS CORRECT:  YES  DICTATION: .Dragon Dictation  PLAN OF CARE: Admit to inpatient   PATIENT DISPOSITION:  ICU - intubated and hemodynamically stable.   Delay start of Pharmacological VTE agent (>24hrs) due to surgical blood loss or risk of bleeding: yes  BASELINE WEIGHT: 112.8 kg

## 2020-03-26 NOTE — Anesthesia Procedure Notes (Signed)
Arterial Line Insertion Start/End11/22/2021 6:50 AM, 03/26/2020 6:56 AM Performed by: CRNA  Patient location: Pre-op. Preanesthetic checklist: patient identified, IV checked, site marked, risks and benefits discussed, surgical consent, monitors and equipment checked, pre-op evaluation, timeout performed and anesthesia consent Lidocaine 1% used for infiltration Right, radial was placed Catheter size: 20 G Hand hygiene performed  and maximum sterile barriers used   Attempts: 1 Procedure performed without using ultrasound guided technique. Following insertion, dressing applied and Biopatch. Post procedure assessment: normal  Patient tolerated the procedure well with no immediate complications.

## 2020-03-26 NOTE — Transfer of Care (Signed)
Immediate Anesthesia Transfer of Care Note  Patient: Marc Johnston  Procedure(s) Performed: CORONARY ARTERY BYPASS GRAFTING (CABG) x 4 , using left internal mammary artery, open left radial artery harvest, and left greater saphenous vein harvested endoscopically.  LIMA TO LAD, SVG TO PD, SVG TO DIAG. RADIAL TO OM (N/A Chest) OPEN RADIAL ARTERY HARVEST (N/A ) TRANSESOPHAGEAL ECHOCARDIOGRAM (TEE) (N/A ) ENDOVEIN HARVEST OF GREATER SAPHENOUS VEIN (Left )  Patient Location: ICU  Anesthesia Type:General  Level of Consciousness: sedated and unresponsive  Airway & Oxygen Therapy: Patient remains intubated per anesthesia plan and Patient placed on Ventilator (see vital sign flow sheet for setting)  Post-op Assessment: Report given to RN and Post -op Vital signs reviewed and stable  Post vital signs: Reviewed and stable  Last Vitals:  Vitals Value Taken Time  BP 94/60 03/26/20 1333  Temp    Pulse 80 03/26/20 1335  Resp 13 03/26/20 1335  SpO2 97 % 03/26/20 1335  Vitals shown include unvalidated device data.  Last Pain:  Vitals:   03/26/20 0554  TempSrc: Oral  PainSc:       Patients Stated Pain Goal: 0 (03/25/20 2000)  Complications: No complications documented.

## 2020-03-27 ENCOUNTER — Encounter (HOSPITAL_COMMUNITY): Payer: Self-pay | Admitting: Thoracic Surgery (Cardiothoracic Vascular Surgery)

## 2020-03-27 ENCOUNTER — Inpatient Hospital Stay (HOSPITAL_COMMUNITY): Payer: BLUE CROSS/BLUE SHIELD

## 2020-03-27 LAB — CBC
HCT: 39.1 % (ref 39.0–52.0)
HCT: 37.8 % — ABNORMAL LOW (ref 39.0–52.0)
Hemoglobin: 13.1 g/dL (ref 13.0–17.0)
Hemoglobin: 12.4 g/dL — ABNORMAL LOW (ref 13.0–17.0)
MCH: 31.9 pg (ref 26.0–34.0)
MCH: 31.1 pg (ref 26.0–34.0)
MCHC: 33.5 g/dL (ref 30.0–36.0)
MCHC: 32.8 g/dL (ref 30.0–36.0)
MCV: 95.1 fL (ref 80.0–100.0)
MCV: 94.7 fL (ref 80.0–100.0)
Platelets: 120 10*3/uL — ABNORMAL LOW (ref 150–400)
Platelets: 121 10*3/uL — ABNORMAL LOW (ref 150–400)
RBC: 4.11 MIL/uL — ABNORMAL LOW (ref 4.22–5.81)
RBC: 3.99 MIL/uL — ABNORMAL LOW (ref 4.22–5.81)
RDW: 12.3 % (ref 11.5–15.5)
RDW: 12.4 % (ref 11.5–15.5)
WBC: 14 10*3/uL — ABNORMAL HIGH (ref 4.0–10.5)
WBC: 14.7 10*3/uL — ABNORMAL HIGH (ref 4.0–10.5)
nRBC: 0 % (ref 0.0–0.2)
nRBC: 0 % (ref 0.0–0.2)

## 2020-03-27 LAB — BASIC METABOLIC PANEL
Anion gap: 10 (ref 5–15)
Anion gap: 9 (ref 5–15)
BUN: 11 mg/dL (ref 6–20)
BUN: 12 mg/dL (ref 6–20)
CO2: 24 mmol/L (ref 22–32)
CO2: 25 mmol/L (ref 22–32)
Calcium: 8.2 mg/dL — ABNORMAL LOW (ref 8.9–10.3)
Calcium: 8.8 mg/dL — ABNORMAL LOW (ref 8.9–10.3)
Chloride: 103 mmol/L (ref 98–111)
Chloride: 100 mmol/L (ref 98–111)
Creatinine, Ser: 0.67 mg/dL (ref 0.61–1.24)
Creatinine, Ser: 0.79 mg/dL (ref 0.61–1.24)
GFR, Estimated: >60 mL/min (ref >60)
GFR, Estimated: >60 mL/min (ref >60)
Glucose, Bld: 156 mg/dL — ABNORMAL HIGH (ref 70–99)
Glucose, Bld: 169 mg/dL — ABNORMAL HIGH (ref 70–99)
Potassium: 4.3 mmol/L (ref 3.5–5.1)
Potassium: 4.1 mmol/L (ref 3.5–5.1)
Sodium: 137 mmol/L (ref 135–145)
Sodium: 134 mmol/L — ABNORMAL LOW (ref 135–145)

## 2020-03-27 LAB — GLUCOSE, CAPILLARY
Glucose-Capillary: 155 mg/dL — ABNORMAL HIGH (ref 70–99)
Glucose-Capillary: 86 mg/dL (ref 70–99)
Glucose-Capillary: 148 mg/dL — ABNORMAL HIGH (ref 70–99)
Glucose-Capillary: 127 mg/dL — ABNORMAL HIGH (ref 70–99)
Glucose-Capillary: 142 mg/dL — ABNORMAL HIGH (ref 70–99)
Glucose-Capillary: 135 mg/dL — ABNORMAL HIGH (ref 70–99)
Glucose-Capillary: 130 mg/dL — ABNORMAL HIGH (ref 70–99)
Glucose-Capillary: 126 mg/dL — ABNORMAL HIGH (ref 70–99)

## 2020-03-27 LAB — POCT I-STAT 7, (LYTES, BLD GAS, ICA,H+H)
Acid-base deficit: 17 mmol/L — ABNORMAL HIGH (ref 0.0–2.0)
Bicarbonate: 9.3 mmol/L — ABNORMAL LOW (ref 20.0–28.0)
Calcium, Ion: 0.69 mmol/L — CL (ref 1.15–1.40)
HCT: 18 % — ABNORMAL LOW (ref 39.0–52.0)
Hemoglobin: 6.1 g/dL — CL (ref 13.0–17.0)
O2 Saturation: 98 %
Patient temperature: 36.1
Potassium: <2 mmol/L — CL (ref 3.5–5.1)
Sodium: 153 mmol/L — ABNORMAL HIGH (ref 135–145)
TCO2: 10 mmol/L — ABNORMAL LOW (ref 22–32)
pCO2 arterial: 21.4 mmHg — ABNORMAL LOW (ref 32.0–48.0)
pH, Arterial: 7.24 — ABNORMAL LOW (ref 7.350–7.450)
pO2, Arterial: 124 mmHg — ABNORMAL HIGH (ref 83.0–108.0)

## 2020-03-27 LAB — MAGNESIUM
Magnesium: 2.2 mg/dL (ref 1.7–2.4)
Magnesium: 2.1 mg/dL (ref 1.7–2.4)

## 2020-03-27 MED ORDER — ROSUVASTATIN CALCIUM 5 MG PO TABS
10.0000 mg | ORAL_TABLET | Freq: Every day | ORAL | Status: DC
  Administered 2020-03-27 – 2020-03-29 (×3): 10 mg via ORAL
  Filled 2020-03-27 (×4): qty 2

## 2020-03-27 MED ORDER — ENOXAPARIN SODIUM 40 MG/0.4ML ~~LOC~~ SOLN
40.0000 mg | Freq: Every day | SUBCUTANEOUS | Status: DC
  Administered 2020-03-27 – 2020-03-29 (×3): 40 mg via SUBCUTANEOUS
  Filled 2020-03-27 (×3): qty 0.4

## 2020-03-27 MED ORDER — INSULIN ASPART 100 UNIT/ML ~~LOC~~ SOLN: 0.0000 [IU] | Freq: Every day | SUBCUTANEOUS | Status: DC

## 2020-03-27 MED ORDER — INSULIN ASPART 100 UNIT/ML ~~LOC~~ SOLN
0.0000 [IU] | Freq: Three times a day (TID) | SUBCUTANEOUS | Status: DC
  Administered 2020-03-28: 2 [IU] via SUBCUTANEOUS

## 2020-03-27 MED ORDER — KETOROLAC TROMETHAMINE 15 MG/ML IJ SOLN
15.0000 mg | Freq: Four times a day (QID) | INTRAMUSCULAR | Status: AC
  Administered 2020-03-27 – 2020-03-28 (×5): 15 mg via INTRAVENOUS
  Filled 2020-03-27 (×5): qty 1

## 2020-03-27 MED ORDER — INSULIN ASPART 100 UNIT/ML ~~LOC~~ SOLN
0.0000 [IU] | SUBCUTANEOUS | Status: DC
  Administered 2020-03-27 (×3): 2 [IU] via SUBCUTANEOUS

## 2020-03-27 MED FILL — Potassium Chloride Inj 2 mEq/ML: INTRAVENOUS | Qty: 40 | Status: AC

## 2020-03-27 MED FILL — Magnesium Sulfate Inj 50%: INTRAMUSCULAR | Qty: 10 | Status: AC

## 2020-03-27 MED FILL — Heparin Sodium (Porcine) Inj 1000 Unit/ML: INTRAMUSCULAR | Qty: 30 | Status: AC

## 2020-03-27 NOTE — Discharge Summary (Addendum)
Physician Discharge Summary       [address] [place]       [name] [address]             [phone]    Patient ID: Marc Johnston MRN: [id number] DOB/AGE: Jun 02, 1975 44 y.o.  Admit date: 03/23/2020 Discharge date: 03/30/2020  Admission Diagnoses: 1. Unstable angina (HCC) 2.   Coronary artery disease  Discharge Diagnoses:  1. S/P CABG x 4 2. Mild thrombocytopenia 3. History of mild OSA (obstructive sleep apnea) 4. History of hypertension 5. History of depression 6. History of tobacco abuse 7. History of complication of anesthesia-wake up aggressive if confused about where he is 8. History of Periapical granuloma  Procedure (s):  1.  Median sternotomy. 2.  Extracorporeal circulation. 3.  Coronary artery bypass grafting x4 (left internal mammary artery to left anterior descending, left radial artery to OM3, saphenous vein graft to first diagonal, saphenous vein graft to posterior descending), endoscopic vein harvest, left leg by Dr. Dorris Fetch on 03/26/2020.  History of Presenting Illness: This is a 44 yo man with multiple CRf including hypertension, tobacco abuse and family history of CAD presents with accelerating angina. First had CP with exertion about a year ago. Would only occur with heavy exertion. Over the past month has had increasing frequency and severity with progressively less exertion. Coronary CT showed significant disease in the LAD, circumflex and RCA. He underwent cath on 03/23/2020 which revealed severe 3 vessel CAD and EF was 55-60%. Dr. Dorris Fetch discussed the need for coronary artery bypass grafting surgery.Potential risks, benefits, and complications of the surgery were discussed with the patient and he agreed to proceed with surgery. Pre operative carotid duplex US showed no significant internal carotid artery stenosis bilaterally. He underwent a CABG x 4 on 03/27/2020.  Brief Hospital Course:   The patient has done well post operatively.   He was weaned off of Neo Synephrine and Nitro drips. His chest tubes and arterial lines were removed without difficulty on POD #1.  He was started on Imdur for his radial artery graft.  He did develop some mild parasthesias and ecchymosis of his left arm, which is not unexpected.  He was maintaining NSR and felt stable for transfer to the progressive care unit on 03/28/2020.  The patient has remained stable.  He was mildly hypertensive and his home Cozaar was resumed.  He had mild thrombocytopenia post op and his last platelet count at 122,000. He remains in NSR and his pacing wires have been removed without difficulty.  He is mildly volume overloaded and was treated with a several day course of Lasix and potassium.  He was having difficulty expectorating sputum.  He was treated with Mucinex for this.  He has a history of rash with use of cough medicine and he was able to take Mucinex (no dextromorphan) without a rash.  The patients incisions are healing without evidence of infection.  He is ambulating independently. Toprol XL was titrated to 50 mg as he was tachycardic (HR in the low 100's) and hypertensive. He still has mild volume overload and will be given several more days of Lasix/potassium after discharge.  He is medically stable for discharge home today.  Latest Vital Signs: Blood pressure 134/80, pulse 90, temperature 98.6 F (37 C), temperature source Oral, resp. rate 20, height 5\' 10"  (1.778 m), weight 113.6 kg, SpO2 97 %.  Physical Exam: Cardiovascular: Slightly tachy with HR in the low 100's Pulmonary: Clear to auscultation on the  right and slightly diminished left base Abdomen: Soft, non tender, bowel sounds present. Extremities: Trace bilateral lower extremity edema. Wounds: Clean and dry.  No erythema or signs of infection.  Discharge Condition: Stable and discharged to home.  Recent laboratory studies:  Lab Results  Component Value Date   WBC 11.4 (H) 03/29/2020   HGB 11.4 (L)  03/29/2020   HCT 34.5 (L) 03/29/2020   MCV 94.0 03/29/2020   PLT 122 (L) 03/29/2020   Lab Results  Component Value Date   NA 138 03/29/2020   K 4.1 03/29/2020   CL 101 03/29/2020   CO2 27 03/29/2020   CREATININE 0.81 03/29/2020   GLUCOSE 117 (H) 03/29/2020      Diagnostic Studies: DG Chest 2 View  Result Date: 03/24/2020 CLINICAL DATA:  Chest pain. EXAM: CHEST - 2 VIEW COMPARISON:  10/29/2015 FINDINGS: Oval radiodensity projects over the midline neck base likely external artifact. Lungs are adequately inflated and otherwise clear. Cardiomediastinal silhouette and remainder of the exam is unchanged. IMPRESSION: No active cardiopulmonary disease. Electronically Signed   By: Elberta Fortis M.D.   On: 03/24/2020 10:51   CARDIAC CATHETERIZATION  Result Date: 03/23/2020 Conclusions: 1. Severe, multivessel coronary artery disease, as detailed below. 2. Normal left ventricular systolic function with mildly elevated filling pressure. Recommendations: 1. Admit for cardiac surgery consultation for consideration of CABG, given multivessel CAD including proximal LAD/D1 involvement.  LAD and RCA could be treated percutaneously, with medical management of occluded ramus intermedius and non-critical distal LCx disease. 2. Aggressive secondary prevention. 3. Obtain echocardiogram. Yvonne Kendall, MD [facility]   CT CORONARY MORPH W/CTA COR W/SCORE W/CA W/CM &/OR WO/CM  Addendum Date: 03/23/2020   ADDENDUM REPORT: 03/23/2020 11:57 ADDENDUM: I have reviewed the CT images again after FFR - there does appear to be a significant non-calcified stenosis of the mid to distal LCx which is likely flow-limiting. Additionally, the proximal RCA appeared hazy and possibly artifactually narrowed, however, further review suggests there may be a small flow channel suggestive of severe napkin-ring type stenosis. The patient was notified of this and the abnormal FFR results and has been scheduled for cardiac cath  today. Electronically Signed   By: Chrystie Nose M.D.   On: 03/23/2020 11:57   Addendum Date: 03/22/2020   ADDENDUM REPORT: 03/22/2020 17:04 HISTORY: 44 yo male with chest pain, nonspecific EXAM: Cardiac/Coronary CTA TECHNIQUE: The patient was scanned on a [employer]. PROTOCOL: A 120 kV prospective scan was triggered in the descending thoracic aorta at 111 HU's. Axial non-contrast 3 mm slices were carried out through the heart. The data set was analyzed on a dedicated work station and scored using the Agatson method. Gantry rotation speed was 250 msecs and collimation was .6 mm. Beta blockade and 0.8 mg of sl NTG was given. The 3D data set was reconstructed in 5% intervals of the 67-82 % of the R-R cycle. Diastolic phases were analyzed on a dedicated work station using MPR, MIP and VRT modes. The patient received 80mL OMNIPAQUE IOHEXOL 350 MG/ML SOLN of contrast. FINDINGS: Quality: Very good, HR 58 Coronary calcium score: The patient's coronary artery calcium score is 79, which places the patient in the 93rd percentile. Coronary arteries: Normal coronary origins.  Appears co-dominant. Right Coronary Artery: Hazy proximal non-calcified stenosis - mild to moderate (CADRADS2). The distal vessel is poorly visualized. Left Main Coronary Artery: Minimal 1-24% distal non-calcified stenosis (CADRADS1). Bifurcates into the LAD and LCx arteries. Left Anterior Descending Coronary Artery: Moderate size anterior  vessel with a large diagonal. Moderate to severe mixed proximal stenosis (CADRADS3). There is moderate to severe mixed disease of the proximal D1 as well. Left Circumflex Artery: Large AV groove vessel with minimal 1-24% mixed proximal stenosis (CADRADS1). Moderate distal L-PDA branch without disease. Aorta: Normal size, 34 mm at the mid ascending aorta (level of the PA bifurcation) measured double oblique. No calcifications. No dissection. Aortic Valve: Trileaflet.  No calcifications. Other findings:  Normal pulmonary vein drainage into the left atrium. Normal left atrial appendage without a thrombus. Dilated main pulmonary artery at 31 mm, suggestive of pulmonary hypertension. IMPRESSION: 1. Moderate to severe proximal mixed LAD stenosis, CADRADS = 3. CT FFR will be submitted. 2. Coronary calcium score of 79. This was 93rd percentile for age and sex matched control. 3. Normal coronary origin with co-dominance. 4. Dilated main pulmonary artery at 31 mm, suggestive of pulmonary hypertension. Electronically Signed   By: Chrystie Nose M.D.   On: 03/22/2020 17:04   Result Date: 03/23/2020 EXAM: OVER-READ INTERPRETATION  CT CHEST The following report is an over-read performed by radiologist Dr. Charlett Nose of [facility] on 03/22/2020. This over-read does not include interpretation of cardiac or coronary anatomy or pathology. The coronary CTA interpretation by the cardiologist is attached. COMPARISON:  None. FINDINGS: Vascular: Heart is normal size.  Aorta normal caliber. Mediastinum/Nodes: No adenopathy. Lungs/Pleura: No confluent opacities or effusions. Upper Abdomen: Imaging into the upper abdomen demonstrates no acute findings. Musculoskeletal: Chest wall soft tissues are unremarkable. No acute bony abnormality. IMPRESSION: No acute or significant extracardiac abnormality. Electronically Signed: By: Charlett Nose M.D. On: 03/22/2020 16:39   DG Chest Port 1 View  Result Date: 03/28/2020 CLINICAL DATA:  Post coronary artery bypass EXAM: PORTABLE CHEST 1 VIEW COMPARISON:  03/27/2020 FINDINGS: Postoperative changes in the mediastinum. Right central venous catheter is unchanged in position. Left chest tube and mediastinal drain have been removed. Mild cardiac enlargement. Shallow inspiration with linear atelectasis in the lung bases. Small left pleural effusion or pleural thickening. No significant progression. IMPRESSION: Interval removal of left chest tube and mediastinal drain. Otherwise no  significant interval change. Electronically Signed   By: Burman Nieves M.D.   On: 03/28/2020 06:28   DG Chest Port 1 View  Result Date: 03/27/2020 CLINICAL DATA:  Chest tube.  Open-heart surgery. EXAM: PORTABLE CHEST 1 VIEW COMPARISON:  03/26/2020. FINDINGS: Interim extubation removal of NG tube. Right IJ sheath, mediastinal drainage catheter, left chest tube in stable position. Prior CABG. Stable cardiomegaly. Low lung volumes with persistent bibasilar atelectasis. No pleural effusion or pneumothorax. Prior cervical spine fusion. IMPRESSION: 1. Interim extubation and removal of NG tube. Right IJ sheath, mediastinal drainage catheter, left chest tube in stable position. No pneumothorax. 2. Prior CABG. Stable cardiomegaly. 3. Low lung volumes persistent bibasilar atelectasis. Electronically Signed   By: Maisie Fus  Register   On: 03/27/2020 07:08   DG Chest Port 1 View  Result Date: 03/26/2020 CLINICAL DATA:  Patient status post CABG today. EXAM: PORTABLE CHEST 1 VIEW COMPARISON:  Single-view of the chest earlier today. FINDINGS: Endotracheal tube remains in place in good position with the tip well above the clavicular heads. Right IJ catheter tip in the mid superior vena cava is unchanged. Left chest tube and mediastinal drain are now in place. A new NG tube courses into the stomach and below the inferior margin of the film. Mild bibasilar atelectasis. No edema or pneumothorax. Heart size is normal. IMPRESSION: Support tubes and lines project in good  position.  No pneumothorax. Bibasilar subsegmental atelectasis. Electronically Signed   By: Drusilla Kanner M.D.   On: 03/26/2020 14:00   DG Chest Portable 1 View  Result Date: 03/26/2020 CLINICAL DATA:  Central line placement. EXAM: PORTABLE CHEST 1 VIEW COMPARISON:  One-view chest x-ray 03/24/2020 FINDINGS: The heart size is exaggerated by low lung volumes. Endotracheal tube terminates 3.1 cm above the carina. Right IJ line terminates in the mid SVC. No  pneumothorax is present. Mild bibasilar airspace opacities likely reflect atelectasis. No other significant airspace consolidation is present. IMPRESSION: 1. Endotracheal tube terminates 3.1 cm above the carina. 2. Right IJ line terminates in the mid SVC. 3. No pneumothorax. Electronically Signed   By: Marin Roberts M.D.   On: 03/26/2020 08:22   CT CORONARY FRACTIONAL FLOW RESERVE DATA PREP  Result Date: 03/23/2020 EXAM: CT FFR ANALYSIS CLINICAL DATA:  Chest pain, nonspecific FINDINGS: FFRct analysis was performed on the original cardiac CT angiogram dataset. Diagrammatic representation of the FFRct analysis is provided in a separate PDF document in PACS. This dictation was created using the PDF document and an interactive 3D model of the results. 3D model is not available in the EMR/PACS. Normal FFR range is >0.80. 1. Left Main: No significant stenosis. FFR = 1.00 2. LAD: Significant stenosis. Proximal FFR = 0.96, Mid FFR = 0.59, Distal FFR = <0.50 3. Large diagonal: Significant stenosis. Proximal FFR = 0.80, Proximal to mid FFR = 0.65 4. LCX: Significant stenosis. Proximal FFR = 0.95, Distal FFR = 0.63 5. RCA: Significant stenosis. Proximal FFR = 1.00, Proximal to mid FFR = 0.53, Distal FFR = <0.50 IMPRESSION: 1. CT FFR demonstrates severe, discrete multi-vessel flow-limiting stenoses of the proximal to mid RCA, LAD, large diagonal and LCX arteries. 2.  Urgent cardiac catheterization is recommended. Electronically Signed   By: Chrystie Nose M.D.   On: 03/23/2020 07:51   ECHOCARDIOGRAM COMPLETE  Result Date: 03/24/2020    ECHOCARDIOGRAM REPORT   Patient Name:   Marc Johnston Date of Exam: 03/24/2020 Medical Rec #:  [id number]      Height:       70.0 in Accession #:    [id number]     Weight:       248.8 lb Date of Birth:  1975/05/17      BSA:          2.290 m Patient Age:    44 years       BP:           146/101 mmHg Patient Gender: M              HR:           61 bpm. Exam Location:  Inpatient  Procedure: 2D Echo Indications:    coronary artery disease  History:        Patient has no prior history of Echocardiogram examinations.                 Signs/Symptoms:Chest Pain; Risk Factors:Hypertension and Sleep                 Apnea.  Sonographer:    Delcie Roch Referring Phys: Jessy.Ferdinand [name] IMPRESSIONS  1. Inferior basal hypokinesis. Left ventricular ejection fraction, by estimation, is 50 to 55%. The left ventricle has low normal function. The left ventricle has no regional wall motion abnormalities. Left ventricular diastolic parameters were normal.  2. Right ventricular systolic function is normal. The right ventricular size is normal.  3.  Left atrial size was mildly dilated.  4. The mitral valve is normal in structure. Trivial mitral valve regurgitation. No evidence of mitral stenosis.  5. The aortic valve is tricuspid. Aortic valve regurgitation is not visualized. No aortic stenosis is present.  6. The inferior vena cava is normal in size with greater than 50% respiratory variability, suggesting right atrial pressure of 3 mmHg. FINDINGS  Left Ventricle: Inferior basal hypokinesis. Left ventricular ejection fraction, by estimation, is 50 to 55%. The left ventricle has low normal function. The left ventricle has no regional wall motion abnormalities. The left ventricular internal cavity size was normal in size. There is no left ventricular hypertrophy. Left ventricular diastolic parameters were normal. Right Ventricle: The right ventricular size is normal. No increase in right ventricular wall thickness. Right ventricular systolic function is normal. Left Atrium: Left atrial size was mildly dilated. Right Atrium: Right atrial size was normal in size. Pericardium: There is no evidence of pericardial effusion. Mitral Valve: The mitral valve is normal in structure. Trivial mitral valve regurgitation. No evidence of mitral valve stenosis. Tricuspid Valve: The tricuspid valve is normal in structure.  Tricuspid valve regurgitation is trivial. No evidence of tricuspid stenosis. Aortic Valve: The aortic valve is tricuspid. Aortic valve regurgitation is not visualized. No aortic stenosis is present. Pulmonic Valve: The pulmonic valve was normal in structure. Pulmonic valve regurgitation is not visualized. No evidence of pulmonic stenosis. Aorta: The aortic root is normal in size and structure. Venous: The inferior vena cava is normal in size with greater than 50% respiratory variability, suggesting right atrial pressure of 3 mmHg. IAS/Shunts: No atrial level shunt detected by color flow Doppler.  LEFT VENTRICLE PLAX 2D LVIDd:         5.50 cm  Diastology LVIDs:         4.10 cm  LV e' medial:    7.94 cm/s LV PW:         1.20 cm  LV E/e' medial:  9.7 LV IVS:        0.80 cm  LV e' lateral:   9.46 cm/s LVOT diam:     2.10 cm  LV E/e' lateral: 8.1 LV SV:         82 LV SV Index:   36 LVOT Area:     3.46 cm  RIGHT VENTRICLE             IVC RV S prime:     12.80 cm/s  IVC diam: 1.50 cm TAPSE (M-mode): 2.4 cm LEFT ATRIUM             Index       RIGHT ATRIUM           Index LA diam:        4.10 cm 1.79 cm/m  RA Area:     13.00 cm LA Vol (A2C):   29.7 ml 12.97 ml/m RA Volume:   30.40 ml  13.27 ml/m LA Vol (A4C):   47.5 ml 20.74 ml/m LA Biplane Vol: 41.9 ml 18.29 ml/m  AORTIC VALVE LVOT Vmax:   119.00 cm/s LVOT Vmean:  77.600 cm/s LVOT VTI:    0.237 m  AORTA Ao Root diam: 3.50 cm Ao Asc diam:  3.60 cm MITRAL VALVE MV Area (PHT): 2.95 cm    SHUNTS MV Decel Time: 257 msec    Systemic VTI:  0.24 m MV E velocity: 76.70 cm/s  Systemic Diam: 2.10 cm MV A velocity: 64.70 cm/s MV E/A ratio:  1.19  Charlton Haws MD Electronically signed by Charlton Haws MD Signature Date/Time: 03/24/2020/4:51:02 PM    Final    ECHO INTRAOPERATIVE TEE  Result Date: 03/26/2020  *INTRAOPERATIVE TRANSESOPHAGEAL REPORT *  Patient Name:   Marc Johnston Date of Exam: 03/26/2020 Medical Rec #:  [id number]      Height:       70.0 in Accession #:     [id number]     Weight:       248.6 lb Date of Birth:  12/14/1975      BSA:          2.29 m Patient Age:    44 years       BP:           135/93 mmHg Patient Gender: M              HR:           63 bpm. Exam Location:  Anesthesiology Transesophogeal exam was perform intraoperatively during surgical procedure. Patient was closely monitored under general anesthesia during the entirety of examination. Indications:     CAD Native vessel Sonographer:     Thurman Coyer RDCS (AE) Performing Phys: [id number] Dec[address] HENDRICKSON Diagnosing Phys: Heather Roberts MD Complications: No known complications during this procedure. POST-OP IMPRESSIONS Overall, there were no significant changes from pre-bypass. PRE-OP FINDINGS  Left Ventricle: The left ventricle has normal systolic function, with an ejection fraction of 60-65% 65%. The cavity size was normal. There is moderately increased left ventricular wall thickness. No evidence of left ventricular regional wall motion abnormalities. Right Ventricle: The right ventricle has normal systolic function. The cavity was normal. There is no increase in right ventricular wall thickness. Left Atrium: Left atrial size was normal in size. The left atrial appendage is well visualized and there is no evidence of thrombus present. Left atrial appendage velocity is normal at greater than 40 cm/s. Right Atrium: Right atrial size was normal in size. Interatrial Septum: No atrial level shunt detected by color flow Doppler. Pericardium: There is no evidence of pericardial effusion. Mitral Valve: The mitral valve is normal in structure. Mild thickening of the mitral valve leaflet. Mild calcification of the mitral valve leaflet. Mitral valve regurgitation is trivial by color flow Doppler. Tricuspid Valve: The tricuspid valve was normal in structure. Tricuspid valve regurgitation is trivial by color flow Doppler. Aortic Valve: The aortic valve is normal in structure. There is mild thickening of the aortic  valve and There is mild calcification of the aortic valve Aortic valve regurgitation was not visualized by color flow Doppler. There is no stenosis of the aortic valve. Pulmonic Valve: The pulmonic valve was normal in structure. Pulmonic valve regurgitation is trivial by color flow Doppler. Aorta: The is normal in size and structure. The aortic arch was not well visualized.  Heather Roberts MD Electronically signed by Heather Roberts MD Signature Date/Time: 03/26/2020/12:58:54 PM    Final    VAS US DOPPLER PRE CABG  Result Date: 03/25/2020 PREOPERATIVE VASCULAR EVALUATION  Indications:      Pre-CABG. Risk Factors:     Hypertension, coronary artery disease. Comparison Study: No prior study Performing Technologist: Sherren Kerns RVS  Examination Guidelines: A complete evaluation includes B-mode imaging, spectral Doppler, color Doppler, and power Doppler as needed of all accessible portions of each vessel. Bilateral testing is considered an integral part of a complete examination. Limited examinations for reoccurring indications may be performed as noted.  Right Carotid Findings: +----------+--------+--------+--------+--------+------------------+  PSV cm/sEDV cm/sStenosisDescribeComments           +----------+--------+--------+--------+--------+------------------+ CCA Prox  1       0                       intimal thickening +----------+--------+--------+--------+--------+------------------+ CCA Distal1       0                       intimal thickening +----------+--------+--------+--------+--------+------------------+ ICA Prox  1       0                                          +----------+--------+--------+--------+--------+------------------+ ICA Distal1       0                                          +----------+--------+--------+--------+--------+------------------+ ECA       2       0                                           +----------+--------+--------+--------+--------+------------------+ Portions of this table do not appear on this page. +----------+--------+-------+--------+------------+           PSV cm/sEDV cmsDescribeArm Pressure +----------+--------+-------+--------+------------+ Subclavian2                                   +----------+--------+-------+--------+------------+ +---------+--------+-+--------+-+ VertebralPSV cm/s0EDV cm/s0 +---------+--------+-+--------+-+ Left Carotid Findings: +----------+--------+--------+--------+--------+------------------+           PSV cm/sEDV cm/sStenosisDescribeComments           +----------+--------+--------+--------+--------+------------------+ CCA Prox  1       0                       intimal thickening +----------+--------+--------+--------+--------+------------------+ CCA Distal1       0                       intimal thickening +----------+--------+--------+--------+--------+------------------+ ICA Prox  1       0                                          +----------+--------+--------+--------+--------+------------------+ ICA Distal1       0                                          +----------+--------+--------+--------+--------+------------------+ ECA       2       0                                          +----------+--------+--------+--------+--------+------------------+ +----------+--------+--------+--------+------------+ SubclavianPSV cm/sEDV cm/sDescribeArm Pressure +----------+--------+--------+--------+------------+           2                                    +----------+--------+--------+--------+------------+ +---------+--------+-+--------+-+  VertebralPSV cm/s0EDV cm/s0 +---------+--------+-+--------+-+  ABI Findings: +--------+------------------+-----+---------+---------------------+ Right   Rt Pressure (mmHg)IndexWaveform Comment                +--------+------------------+-----+---------+---------------------+ Brachial                       triphasicBifurcation at the [facility] +--------+------------------+-----+---------+---------------------+ PTA                            triphasic                      +--------+------------------+-----+---------+---------------------+ DP                             triphasic                      +--------+------------------+-----+---------+---------------------+ +--------+------------------+-----+---------+---------------------+ Left    Lt Pressure (mmHg)IndexWaveform Comment               +--------+------------------+-----+---------+---------------------+ [id number]                    triphasicBifurcation at the [facility] +--------+------------------+-----+---------+---------------------+ PTA                            triphasic                      +--------+------------------+-----+---------+---------------------+ DP                             triphasic                      +--------+------------------+-----+---------+---------------------+  Right Doppler Findings: +--------+--------+-----+---------+----------------------------------------+ Site    PressureIndexDoppler  Comments                                 +--------+--------+-----+---------+----------------------------------------+ Brachial             triphasicBifurcation at the [facility]                    +--------+--------+-----+---------+----------------------------------------+ Radial               triphasicDoppler signal reverses with compression +--------+--------+-----+---------+----------------------------------------+ Ulnar                triphasic                                         +--------+--------+-----+---------+----------------------------------------+  Left Doppler Findings: +--------+--------+-----+---------+----------------------------------------+ Site    PressureIndexDoppler   Comments                                 +--------+--------+-----+---------+----------------------------------------+ [id number]          triphasicBifurcation at the [facility]                    +--------+--------+-----+---------+----------------------------------------+ Radial               triphasic                                         +--------+--------+-----+---------+----------------------------------------+  Ulnar                triphasicDoppler signal reverses with compression +--------+--------+-----+---------+----------------------------------------+  Summary: Right Carotid: The extracranial vessels were near-normal with only minimal wall                thickening or plaque. Left Carotid: The extracranial vessels were near-normal with only minimal wall               thickening or plaque. Vertebrals:  Bilateral vertebral arteries demonstrate antegrade flow. Subclavians: Normal flow hemodynamics were seen in bilateral subclavian              arteries. Right Upper Extremity: Doppler waveforms remain within normal limits with right radial compression. Doppler waveforms remain within normal limits with right ulnar compression. Left Upper Extremity: Doppler waveforms remain within normal limits with left radial compression. Doppler waveform obliterate with left ulnar compression.  Electronically signed by Waverly Ferrarihristopher Dickson MD on 03/25/2020 at 6:23:44 PM.    Final      Discharge Medications: Allergies as of 03/30/2020      Reactions   Morphine Other (See Comments)   Severe agitation   Other Rash   Cough suppressant, unsure which one      Medication List    STOP taking these medications   metoprolol tartrate 100 MG tablet Commonly known as: LOPRESSOR     TAKE these medications   acetaminophen 500 MG tablet Commonly known as: TYLENOL Take 1,000 mg by mouth every 6 (six) hours as needed for mild pain, fever or headache.   aspirin 325 MG EC tablet Take 1 tablet (325 mg  total) by mouth daily. What changed:   medication strength  how much to take  additional instructions   furosemide 40 MG tablet Commonly known as: LASIX Take 1 tablet (40 mg total) by mouth daily. For 5 days then stop.   guaiFENesin 600 MG 12 hr tablet Commonly known as: MUCINEX Take 1 tablet (600 mg total) by mouth 2 (two) times daily as needed for to loosen phlegm.   isosorbide mononitrate 30 MG 24 hr tablet Commonly known as: IMDUR Take 0.5 tablets (15 mg total) by mouth daily. For one month then stop.   losartan 25 MG tablet Commonly known as: COZAAR Take 25 mg by mouth daily.   metoprolol succinate 50 MG 24 hr tablet Commonly known as: TOPROL-XL Take 1 tablet (50 mg total) by mouth daily. Take with or immediately following a meal.   nicotine 21 mg/24hr patch Commonly known as: NICODERM CQ - dosed in mg/24 hours Place 1 patch (21 mg total) onto the skin daily.   oxyCODONE 5 MG immediate release tablet Commonly known as: Oxy IR/ROXICODONE Take 1 tablet (5 mg total) by mouth every 4 (four) hours as needed for severe pain.   potassium chloride SA 20 MEQ tablet Commonly known as: KLOR-CON Take 1 tablet (20 mEq total) by mouth daily. For 5 days then stop.   rosuvastatin 10 MG tablet Commonly known as: CRESTOR Take 10 mg by mouth at bedtime.      The patient has been discharged on:   1.Beta Blocker:  Yes [ X  ]                              No   [   ]  If No, reason:  2.Ace Inhibitor/ARB: Yes [ X  ]                                     No  [    ]                                     If No, reason:  3.Statin:   Yes [ X  ]                  No  [   ]                  If No, reason:  4.Ecasa:  Yes  [ X  ]                  No   [   ]                  If No, reason:  Follow Up Appointments:  Follow-up Information    Call Elfredia Nevins, MD.   Specialty: Internal Medicine Why: for follow up regarding further surveillance of HGA1C  5.6 Contact information: [address] [place] [address] [phone]        [name], MD. Go on 05/01/2020.   Specialty: Cardiothoracic Surgery Why: PA/LAT CXR to be taken (at [facility] which is in the same building as Dr. Sunday Corn office) on 12/28 at 9:00 am; Appointment time is at 9:30 am Contact information: [address] [employer] [address] [place] [address] [phone]        Azalee Course, [place]. Go on 04/24/2020.   Specialties: Cardiology, Radiology Why: Appointment 3:45 pm Contact information: [address] [place] [address] [phone]               Signed: Lelon Huh [facility] 03/30/2020, 7:32 AM

## 2020-03-27 NOTE — Op Note (Signed)
NAME: UNIQUE, SEARFOSS MEDICAL RECORD IR:[id number] ACCOUNT [id number] DATE OF BIRTH:02-23-1976 FACILITY: [facility] LOCATION: [facility] PHYSICIAN:Tahjir Silveria Lars Pinks, MD  OPERATIVE REPORT  DATE OF PROCEDURE:  03/26/2020  PREOPERATIVE DIAGNOSES:  Three-vessel coronary disease with accelerating angina.  POSTOPERATIVE DIAGNOSES:  Three-vessel coronary disease with accelerating angina.  PROCEDURE: 1.  Median sternotomy. 2.  Extracorporeal circulation. 3.  Coronary artery bypass grafting x4  Left internal mammary artery to left anterior descending,  Left radial artery to OM3,  Saphenous vein graft to first diagonal,  Saphenous vein graft to posterior descending Endoscopic vein harvest left leg.  CLINICAL NOTE:  Mr. Marc Johnston is a 44 year old gentleman with multiple cardiac risk factors and a family history of coronary artery disease who presented with about a 1-year history of chest pain with exertion.  Over the past month he has had increasing  frequency and severity.  Coronary CT showed significant disease and cardiac catheterization revealed severe 3-vessel coronary disease with ejection fraction of 55% to 60%.  He was referred for coronary artery bypass grafting.  The indications, risks,  benefits, and alternatives were discussed in detail with the patient.  He understood and accepted the risks and agreed to proceed.  DESCRIPTION OF PROCEDURE:  Mr. Marc Johnston was brought to the preoperative holding area on 03/26/2020.  Anesthesia placed a Swan-Ganz catheter and an arterial blood pressure monitoring line.  Allen's test was performed with a pulse ox on the left index finger  and there was no loss of pulsatility with radial occlusion consistent with the findings of an intact palmar arch on his preoperative vascular studies.  Decision was made to evaluate the artery intraoperatively for use as a possible graft.  He was taken  to the Operating Room, anesthetized and intubated.  A Foley catheter was placed.   Intravenous antibiotics were administered.  The chest, abdomen, legs and left arm were prepped and draped in the usual sterile fashion.  Transesophageal echocardiography  was performed by Dr. Claybon Jabs of anesthesia.  It showed preserved left ventricular wall motion with no significant valvular pathology.  A timeout was performed.  The conduits were harvested simultaneously.  An incision was made in the medial aspect  of the right leg at the level of the knee.  The greater saphenous vein was identified.  The vein was explored endoscopically and had an unusual branching pattern.  The vein was NOT harvested from the right leg.  An incision was made at the level of the  knee on the left side and the greater saphenous vein was harvested from the left thigh.  It was a good quality vein.  Simultaneously, an incision was made over the volar aspect of the left wrist.  A short segment of the radial artery was dissected out.   There was a good pulse distally and good Doppler signal in the palmar arch distally with proximal occlusion.  The incision was extended to just below the antecubital fossa and the radial artery was harvested using the Harmonic scalpel.  Also,  simultaneously a median sternotomy was performed.  The left internal mammary artery was harvested under direct vision.  Two thousand units of heparin was administered during the vessel harvest.  After the radial incision was closed, the remainder of the  full heparin dose was given.  The arm and leg incisions were closed in standard fashion.  Sternal retractor was placed and gradually opened over time.  The pericardium was opened.  The ascending aorta was of normal caliber with no evidence of  atherosclerotic disease.  After confirming adequate anticoagulation with ACT measurement, the aorta  was cannulated via concentric 2-0 Ethibond pledgeted pursestring sutures.  A dual stage venous cannula was placed via a pursestring suture in the right atrial  appendage.  Cardiopulmonary bypass was initiated.  Flows were maintained per protocol.  The  patient was cooled to 32 degrees Celsius.  The coronary arteries were inspected and anastomotic sites were chosen.  The conduits were inspected and cut to length. A foam pad was placed in the pericardium to insulate the heart.  A temperature probe was  placed in the myocardial septum and a cardioplegia cannula was placed in the ascending aorta.  The aorta was crossclamped.  The left ventricle was emptied via the aortic root vent.  Cardiac arrest then was achieved with a combination of cold antegrade blood cardioplegia and topical iced saline.  One liter of cardioplegia was administered.  There  was a rapid diastolic arrest.  There was septal cooling to 11 degrees Celsius.  A reversed saphenous vein graft was placed end-to-side to the posterior descending.  This was a 1.5 mm good quality target, it was a better quality vessel that it appeared on catheterization.  The vein was of good quality.  It was anastomosed end-to-side  with a running 7-0 Prolene suture.  All anastomoses were probed proximally and distally at their completion to ensure patency.  Cardioplegia was administered down the vein graft and there was good flow and good hemostasis.  Next, a reversed saphenous vein graft was placed end-to-side to the first diagonal branch of the LAD.  This was a 1.5 mm vessel.  It did have some plaque, but overall was a good quality target.  It bifurcated just beyond the anastomosis and a probe  passed easily down both branches.  An end-to-side anastomosis was performed with a running 7-0 Prolene suture.  Again, a probe passed easily and there was good flow and good hemostasis with cardioplegia administration.  Additional cardioplegia was also  administered antegrade.  The heart was elevated, exposing the posterolateral wall.  OM3 was the dominant posterolateral branch.  It was a 2 mm good quality target.  The radial  artery was bevelled and was anastomosed end-to-side to OM3 with a running 8-0 Prolene suture.  At  completion of the anastomosis, a probe passed easily.  Additional cardioplegia was administered down the aortic root and there was good backbleeding from the radial artery.  The left internal mammary artery was brought through a window in the pericardium.  The distal end was bevelled.  It was anastomosed end-to-side to the LAD.  The LAD had a plaque evident throughout, but a probe passed easily proximally and distally.  It  was a 2 mm target vessel.  The mammary was a 2 mm conduit.  An end-to-side anastomosis was performed with a running 8-0 Prolene suture.  After completion of the anastomosis, the bulldog clamp was briefly removed.  Septal rewarming was noted.  The bulldog  clamp was replaced.  The mammary pedicle was tacked to the epicardial surface of the heart with 6-0 Prolene sutures.  Additional cardioplegia was administered.  The vein grafts were cut to length.  The proximal anastomosis for the vein grafts were performed to 4.0 mm punch aortotomies with running 6-0 Prolene sutures.  A venotomy was made near the origin of the diagonal  graft and the radial graft was sewn end-to-side to this with a running 7-0 Prolene suture.  At the completion of the final  proximal anastomosis, the patient was placed in Trendelenburg position.  Lidocaine was administered.  The aortic root was de-aired  and the aortic crossclamp was removed.  The total crossclamp time was 67 minutes.  The patient did not require defibrillation.  He resumed a bradycardic rhythm.  While rewarming was completed, all proximal and distal anastomoses were inspected for  hemostasis.  Epicardial pacing wires were placed on the right ventricle and right atrium.  When the patient had rewarmed to a core temperature of 37 degrees Celsius, he was weaned from cardiopulmonary bypass on the first attempt.  Total bypass time was  111 minutes.  Cardiac  output was monitored by the flow track device and the index was greater than 2 liters per minute per meter squared.  After weaning from bypass transesophageal echocardiography was unchanged from the pre-bypass study.  A test dose of protamine was administered.  There was transient hypotension, which responded to resuscitative measures.  The remainder of the protamine was administered without incident.  The atrial and aortic cannulae were removed.  The remainder of the  protamine was administered without incident.  The chest was irrigated with warm saline.  Hemostasis was achieved.  Left pleural and mediastinal chest tubes were placed through separate subcostal incisions.  The pericardium was reapproximated with  interrupted 3-0 silk sutures.  It came together easily without tension.  The sternum was closed with a combination of single and double heavy gauge stainless steel wires.  Pectoralis fascia, subcutaneous tissue and skin were closed in standard fashion.   All sponge, needle and instrument counts were correct at the end of the procedure.  The patient was taken from the operating room to the Postanesthetic Care Unit in good condition.  IN/NUANCE  D:03/26/2020 T:03/27/2020 JOB:[id number]

## 2020-03-27 NOTE — Progress Notes (Signed)
EVENING ROUNDS NOTE :     [address]       [name] [address]             [phone]                 1 Day Post-Op Procedure(s) (LRB): CORONARY ARTERY BYPASS GRAFTING (CABG) x 4 , using left internal mammary artery, open left radial artery harvest, and left greater saphenous vein harvested endoscopically.  LIMA TO LAD, SVG TO PD, SVG TO DIAG. RADIAL TO OM (N/A) OPEN RADIAL ARTERY HARVEST (N/A) TRANSESOPHAGEAL ECHOCARDIOGRAM (TEE) (N/A) ENDOVEIN HARVEST OF GREATER SAPHENOUS VEIN (Left)   Total Length of Stay:  LOS: 4 days  Events:   Doing well Up to chair Likely floor tomorrow    BP (!) 120/95   Pulse 89   Temp 98.4 F (36.9 C) (Oral)   Resp (!) 26   Ht 5\' 10"  (1.778 m)   Wt 114.2 kg   SpO2 97%   BMI 36.12 kg/m         . sodium chloride 20 mL/hr at 03/27/20 0700  . sodium chloride    . sodium chloride    . cefUROXime (ZINACEF)  IV 1.5 g (03/27/20 0825)  . dexmedetomidine (PRECEDEX) IV infusion Stopped (03/26/20 1521)  . lactated ringers    . lactated ringers    . lactated ringers 20 mL/hr at 03/27/20 0700  . phenylephrine (NEO-SYNEPHRINE) Adult infusion Stopped (03/27/20 0251)    I/O last 3 completed shifts: In: 6123.6 [I.V.:3537.2; Blood:440; IV Piggyback:2146.4] Out: 4158 [Urine:3125; Blood:293; Chest Tube:740]   CBC Latest Ref Rng & Units 03/27/2020 03/26/2020 03/26/2020  WBC 4.0 - 10.5 K/uL 14.0(H) 17.3(H) -  Hemoglobin 13.0 - 17.0 g/dL 03/28/2020 32.3 12.9(L)  Hematocrit 39 - 52 % 39.1 39.3 38.0(L)  Platelets 150 - 400 K/uL 120(L) 120(L) -    BMP Latest Ref Rng & Units 03/27/2020 03/26/2020 03/26/2020  Glucose 70 - 99 mg/dL 03/28/2020) 322(G) -  BUN 6 - 20 mg/dL 11 9 -  Creatinine 254(Y - 1.24 mg/dL 7.06 2.37 -  Sodium 6.28 - 145 mmol/L 137 136 141  Potassium 3.5 - 5.1 mmol/L 4.3 4.7 5.1  Chloride 98 - 111 mmol/L 103 108 -  CO2 22 - 32 mmol/L 24 24 -  Calcium 8.9 - 10.3 mg/dL 8.2(L) 7.7(L) -    ABG    Component Value Date/Time    PHART 7.323 (L) 03/26/2020 1705   PCO2ART 44.4 03/26/2020 1705   PO2ART 85 03/26/2020 1705   HCO3 23.1 03/26/2020 1705   TCO2 24 03/26/2020 1705   ACIDBASEDEF 3.0 (H) 03/26/2020 1705   O2SAT 96.0 03/26/2020 1705       03/28/2020, MD 03/27/2020 3:46 PM

## 2020-03-27 NOTE — Progress Notes (Signed)
1 Day Post-Op Procedure(s) (LRB): CORONARY ARTERY BYPASS GRAFTING (CABG) x 4 , using left internal mammary artery, open left radial artery harvest, and left greater saphenous vein harvested endoscopically.  LIMA TO LAD, SVG TO PD, SVG TO DIAG. RADIAL TO OM (N/A) OPEN RADIAL ARTERY HARVEST (N/A) TRANSESOPHAGEAL ECHOCARDIOGRAM (TEE) (N/A) ENDOVEIN HARVEST OF GREATER SAPHENOUS VEIN (Left) Subjective: Up in chair, no complaints  Objective: Vital signs in last 24 hours: Temp:  [97 F (36.1 C)-98.8 F (37.1 C)] 98.6 F (37 C) (11/23 0500) Pulse Rate:  [87-90] 89 (11/23 0700) Cardiac Rhythm: Atrial paced (11/22 2000) Resp:  [6-31] 13 (11/23 0700) BP: (115)/(73) 115/73 (11/22 1450) SpO2:  [89 %-100 %] 94 % (11/23 0700) Arterial Line BP: (87-143)/(43-88) 117/63 (11/23 0700) FiO2 (%):  [40 %-50 %] 40 % (11/22 1524) Weight:  [114.2 kg] 114.2 kg (11/23 0551)  Hemodynamic parameters for last 24 hours:    Intake/Output from previous day: 11/22 0701 - 11/23 0700 In: 6120.6 [I.V.:3534.2; Blood:440; IV Piggyback:2146.4] Out: 4158 [Urine:3125; Blood:293; Chest Tube:740] Intake/Output this shift: No intake/output data recorded.  General appearance: alert, cooperative and no distress Neurologic: intact Heart: regular rate and rhythm Lungs: diminished breath sounds bibasilar Extremities: left hand neuro intact, brisk cap refill  Lab Results: Recent Labs    03/26/20 1901 03/27/20 0358  WBC 17.3* 14.0*  HGB 13.2 13.1  HCT 39.3 39.1  PLT 120* 120*   BMET:  Recent Labs    03/26/20 1901 03/27/20 0358  NA 136 137  K 4.7 4.3  CL 108 103  CO2 24 24  GLUCOSE 135* 156*  BUN 9 11  CREATININE 0.63 0.67  CALCIUM 7.7* 8.2*    PT/INR:  Recent Labs    03/26/20 1351  LABPROT 18.6*  INR 1.6*   ABG    Component Value Date/Time   PHART 7.323 (L) 03/26/2020 1705   HCO3 23.1 03/26/2020 1705   TCO2 24 03/26/2020 1705   ACIDBASEDEF 3.0 (H) 03/26/2020 1705   O2SAT 96.0 03/26/2020 1705    CBG (last 3)  Recent Labs    03/27/20 0224 03/27/20 0402 03/27/20 0756  GLUCAP 148* 127* 142*    Assessment/Plan: S/P Procedure(s) (LRB): CORONARY ARTERY BYPASS GRAFTING (CABG) x 4 , using left internal mammary artery, open left radial artery harvest, and left greater saphenous vein harvested endoscopically.  LIMA TO LAD, SVG TO PD, SVG TO DIAG. RADIAL TO OM (N/A) OPEN RADIAL ARTERY HARVEST (N/A) TRANSESOPHAGEAL ECHOCARDIOGRAM (TEE) (N/A) ENDOVEIN HARVEST OF GREATER SAPHENOUS VEIN (Left) -POD # 1 Looks great CV- in SR, paced at 90, good hemodynamics- dc A line  ASA, crestor, beta blocker, resume ACE/ARB when BP allows  Imdur for radial graft, dc IV NTG RESP- IS for basilar atelectasis RENAL- creatinine and lytes OK  Dc Foley ENDO- CBG moderately elevated, SSI Thrombocytopenia- mild, no bleeding, follow SCD + enoxaparin for DVT prophylaxis Cardiac rehab Dc chest tubes     LOS: 4 days    [name] 03/27/2020

## 2020-03-28 ENCOUNTER — Inpatient Hospital Stay (HOSPITAL_COMMUNITY): Payer: BLUE CROSS/BLUE SHIELD

## 2020-03-28 LAB — CBC
HCT: 36 % — ABNORMAL LOW (ref 39.0–52.0)
Hemoglobin: 11.7 g/dL — ABNORMAL LOW (ref 13.0–17.0)
MCH: 31.5 pg (ref 26.0–34.0)
MCHC: 32.5 g/dL (ref 30.0–36.0)
MCV: 96.8 fL (ref 80.0–100.0)
Platelets: 113 10*3/uL — ABNORMAL LOW (ref 150–400)
RBC: 3.72 MIL/uL — ABNORMAL LOW (ref 4.22–5.81)
RDW: 12.5 % (ref 11.5–15.5)
WBC: 13.8 10*3/uL — ABNORMAL HIGH (ref 4.0–10.5)
nRBC: 0 % (ref 0.0–0.2)

## 2020-03-28 LAB — GLUCOSE, CAPILLARY
Glucose-Capillary: 125 mg/dL — ABNORMAL HIGH (ref 70–99)
Glucose-Capillary: 104 mg/dL — ABNORMAL HIGH (ref 70–99)
Glucose-Capillary: 118 mg/dL — ABNORMAL HIGH (ref 70–99)
Glucose-Capillary: 113 mg/dL — ABNORMAL HIGH (ref 70–99)

## 2020-03-28 LAB — BASIC METABOLIC PANEL
Anion gap: 7 (ref 5–15)
BUN: 15 mg/dL (ref 6–20)
CO2: 27 mmol/L (ref 22–32)
Calcium: 8.7 mg/dL — ABNORMAL LOW (ref 8.9–10.3)
Chloride: 100 mmol/L (ref 98–111)
Creatinine, Ser: 0.72 mg/dL (ref 0.61–1.24)
GFR, Estimated: >60 mL/min (ref >60)
Glucose, Bld: 134 mg/dL — ABNORMAL HIGH (ref 70–99)
Potassium: 4.4 mmol/L (ref 3.5–5.1)
Sodium: 134 mmol/L — ABNORMAL LOW (ref 135–145)

## 2020-03-28 MED ORDER — SODIUM CHLORIDE 0.9% FLUSH: 3.0000 mL | INTRAVENOUS | Status: DC | PRN

## 2020-03-28 MED ORDER — LACTULOSE 10 GM/15ML PO SOLN
20.0000 g | Freq: Once | ORAL | Status: AC
  Administered 2020-03-28: 20 g via ORAL
  Filled 2020-03-28: qty 30

## 2020-03-28 MED ORDER — SODIUM CHLORIDE 0.9 % IV SOLN: 250.0000 mL | INTRAVENOUS | Status: DC | PRN

## 2020-03-28 MED ORDER — SODIUM CHLORIDE 0.9% FLUSH
3.0000 mL | Freq: Two times a day (BID) | INTRAVENOUS | Status: DC
  Administered 2020-03-28 – 2020-03-29 (×2): 3 mL via INTRAVENOUS

## 2020-03-28 MED ORDER — METOPROLOL SUCCINATE ER 25 MG PO TB24
25.0000 mg | ORAL_TABLET | Freq: Every day | ORAL | Status: DC
  Administered 2020-03-28 – 2020-03-29 (×2): 25 mg via ORAL
  Filled 2020-03-28 (×2): qty 1

## 2020-03-28 MED ORDER — MAGNESIUM HYDROXIDE 400 MG/5ML PO SUSP
30.0000 mL | Freq: Every day | ORAL | Status: DC | PRN
  Filled 2020-03-28: qty 30

## 2020-03-28 MED ORDER — ~~LOC~~ CARDIAC SURGERY, PATIENT & FAMILY EDUCATION: Freq: Once | Status: DC

## 2020-03-28 MED ORDER — ALUM & MAG HYDROXIDE-SIMETH 200-200-20 MG/5ML PO SUSP: 15.0000 mL | Freq: Four times a day (QID) | ORAL | Status: DC | PRN

## 2020-03-28 MED ORDER — ZOLPIDEM TARTRATE 5 MG PO TABS: 5.0000 mg | ORAL_TABLET | Freq: Every evening | ORAL | Status: DC | PRN

## 2020-03-28 MED ORDER — LOSARTAN POTASSIUM 25 MG PO TABS: 25.0000 mg | ORAL_TABLET | Freq: Every day | ORAL | Status: DC

## 2020-03-28 NOTE — Progress Notes (Signed)
CARDIAC REHAB PHASE I   PRE:  Rate/Rhythm: 85 SR  BP:  Sitting: 124/64      SaO2: 98 2L  MODE:  Ambulation: 170 ft   POST:  Rate/Rhythm: 102 ST  BP:  Sitting: 130/71    SaO2: 94 RA   Pt ambulated 110ft in hallway standby assist with slow, steady gait. Pt took one standing rest break c/o SOB, coached through purse lipped breathing. Pt returned to recliner. Encouraged continued ambulation and IS use. Pt denies DME needs as he has a walker at home. Will continue to follow.  [phone] Reynold Bowen, RN BSN 03/28/2020 2:06 PM

## 2020-03-28 NOTE — Progress Notes (Signed)
Pt arrived from unit from 2 heart. Bp 130/85(98) Sp 02 2L [place] 98 RR 20 HR 83 .Will continue to monitor. Pt. Oriented to unit   Everlean Cherry, RN

## 2020-03-28 NOTE — Progress Notes (Signed)
Mobility Specialist: Progress Note   03/28/20 1647  Mobility  Activity Refused mobility   Pt refused mobility stating he had just received his medications. Will f/u as time allows.   [facility] Jerrold Haskell Mobility Specialist

## 2020-03-28 NOTE — Progress Notes (Signed)
2 Days Post-Op Procedure(s) (LRB): CORONARY ARTERY BYPASS GRAFTING (CABG) x 4 , using left internal mammary artery, open left radial artery harvest, and left greater saphenous vein harvested endoscopically.  LIMA TO LAD, SVG TO PD, SVG TO DIAG. RADIAL TO OM (N/A) OPEN RADIAL ARTERY HARVEST (N/A) TRANSESOPHAGEAL ECHOCARDIOGRAM (TEE) (N/A) ENDOVEIN HARVEST OF GREATER SAPHENOUS VEIN (Left) Subjective: Some soreness but overall feels better  Objective: Vital signs in last 24 hours: Temp:  [97.7 F (36.5 C)-98.4 F (36.9 C)] 98.3 F (36.8 C) (11/24 0000) Pulse Rate:  [66-92] 69 (11/24 0700) Cardiac Rhythm: Atrial paced (11/23 2000) Resp:  [11-36] 12 (11/24 0700) BP: (99-143)/(56-95) 122/90 (11/24 0700) SpO2:  [93 %-98 %] 95 % (11/24 0700) Arterial Line BP: (111-122)/(50-58) 113/50 (11/23 1000) Weight:  [114.9 kg] 114.9 kg (11/24 0452)  Hemodynamic parameters for last 24 hours:    Intake/Output from previous day: 11/23 0701 - 11/24 0700 In: 299.7 [I.V.:99.8; IV Piggyback:199.9] Out: 800 [Urine:800] Intake/Output this shift: No intake/output data recorded.  General appearance: alert, cooperative and no distress Neurologic: intact Heart: regular rate and rhythm Lungs: diminished breath sounds bibasilar Abdomen: normal findings: soft, non-tender  Lab Results: Recent Labs    03/27/20 1800 03/28/20 0405  WBC 14.7* 13.8*  HGB 12.4* 11.7*  HCT 37.8* 36.0*  PLT 121* 113*   BMET:  Recent Labs    03/27/20 1800 03/28/20 0405  NA 134* 134*  K 4.1 4.4  CL 100 100  CO2 25 27  GLUCOSE 169* 134*  BUN 12 15  CREATININE 0.79 0.72  CALCIUM 8.8* 8.7*    PT/INR:  Recent Labs    03/26/20 1351  LABPROT 18.6*  INR 1.6*   ABG    Component Value Date/Time   PHART 7.323 (L) 03/26/2020 1705   HCO3 23.1 03/26/2020 1705   TCO2 24 03/26/2020 1705   ACIDBASEDEF 3.0 (H) 03/26/2020 1705   O2SAT 96.0 03/26/2020 1705   CBG (last 3)  Recent Labs    03/27/20 1617 03/27/20 2203  03/28/20 0642  GLUCAP 130* 126* 125*    Assessment/Plan: S/P Procedure(s) (LRB): CORONARY ARTERY BYPASS GRAFTING (CABG) x 4 , using left internal mammary artery, open left radial artery harvest, and left greater saphenous vein harvested endoscopically.  LIMA TO LAD, SVG TO PD, SVG TO DIAG. RADIAL TO OM (N/A) OPEN RADIAL ARTERY HARVEST (N/A) TRANSESOPHAGEAL ECHOCARDIOGRAM (TEE) (N/A) ENDOVEIN HARVEST OF GREATER SAPHENOUS VEIN (Left) Plan for transfer to step-down: see transfer orders  POD # 2 Doing well CV- in SR  ASA, crestor, Toprol XL, Imdur for radial graft  Resume losartan tomorrow RESP- continue IS RENAL- creatinine and lytes OK ENDO- CBG well controlled, continue AC and HS Thrombocytopenia- stable, monitor Ambulating well    LOS: 5 days    [name] 03/28/2020

## 2020-03-28 NOTE — Progress Notes (Signed)
Mobility Specialist: Progress Note   03/28/20 1538  Mobility  Activity Ambulated in hall  Level of Assistance Modified independent, requires aide device or extra time  Assistive Device Front wheel walker  Distance Ambulated (ft) 420 ft  Mobility Response Tolerated well  Mobility performed by Family member  Bed Position Chair   Pt ambulated in hallway with wife. Pt's sats maintained in the low to mid 90s during ambulation. Will f/u as time allows.   [facility] Chester Romero Mobility Specialist

## 2020-03-29 LAB — GLUCOSE, CAPILLARY
Glucose-Capillary: 119 mg/dL — ABNORMAL HIGH (ref 70–99)
Glucose-Capillary: 74 mg/dL (ref 70–99)
Glucose-Capillary: 99 mg/dL (ref 70–99)

## 2020-03-29 LAB — BASIC METABOLIC PANEL
Anion gap: 10 (ref 5–15)
BUN: 13 mg/dL (ref 6–20)
CO2: 27 mmol/L (ref 22–32)
Calcium: 8.7 mg/dL — ABNORMAL LOW (ref 8.9–10.3)
Chloride: 101 mmol/L (ref 98–111)
Creatinine, Ser: 0.81 mg/dL (ref 0.61–1.24)
GFR, Estimated: >60 mL/min (ref >60)
Glucose, Bld: 117 mg/dL — ABNORMAL HIGH (ref 70–99)
Potassium: 4.1 mmol/L (ref 3.5–5.1)
Sodium: 138 mmol/L (ref 135–145)

## 2020-03-29 LAB — CBC
HCT: 34.5 % — ABNORMAL LOW (ref 39.0–52.0)
Hemoglobin: 11.4 g/dL — ABNORMAL LOW (ref 13.0–17.0)
MCH: 31.1 pg (ref 26.0–34.0)
MCHC: 33 g/dL (ref 30.0–36.0)
MCV: 94 fL (ref 80.0–100.0)
Platelets: 122 10*3/uL — ABNORMAL LOW (ref 150–400)
RBC: 3.67 MIL/uL — ABNORMAL LOW (ref 4.22–5.81)
RDW: 12.4 % (ref 11.5–15.5)
WBC: 11.4 10*3/uL — ABNORMAL HIGH (ref 4.0–10.5)
nRBC: 0 % (ref 0.0–0.2)

## 2020-03-29 MED ORDER — POTASSIUM CHLORIDE CRYS ER 20 MEQ PO TBCR
20.0000 meq | EXTENDED_RELEASE_TABLET | Freq: Every day | ORAL | Status: DC
  Administered 2020-03-29 – 2020-03-30 (×2): 20 meq via ORAL
  Filled 2020-03-29 (×2): qty 1

## 2020-03-29 MED ORDER — FUROSEMIDE 40 MG PO TABS
40.0000 mg | ORAL_TABLET | Freq: Every day | ORAL | Status: DC
  Administered 2020-03-29 – 2020-03-30 (×2): 40 mg via ORAL
  Filled 2020-03-29 (×2): qty 1

## 2020-03-29 MED ORDER — DIPHENHYDRAMINE HCL 50 MG/ML IJ SOLN: 12.5000 mg | Freq: Four times a day (QID) | INTRAMUSCULAR | Status: DC | PRN

## 2020-03-29 MED ORDER — GUAIFENESIN ER 600 MG PO TB12
1200.0000 mg | ORAL_TABLET | Freq: Two times a day (BID) | ORAL | Status: DC | PRN
  Administered 2020-03-29: 1200 mg via ORAL
  Filled 2020-03-29: qty 2

## 2020-03-29 MED ORDER — LOSARTAN POTASSIUM 25 MG PO TABS
25.0000 mg | ORAL_TABLET | Freq: Every day | ORAL | Status: DC
  Administered 2020-03-29 – 2020-03-30 (×2): 25 mg via ORAL
  Filled 2020-03-29 (×2): qty 1

## 2020-03-29 MED ORDER — ASPIRIN 325 MG PO TBEC: 325.0000 mg | DELAYED_RELEASE_TABLET | Freq: Every day | ORAL | 0 refills | Status: AC

## 2020-03-29 MED FILL — Lidocaine HCl Local Soln Prefilled Syringe 100 MG/5ML (2%): INTRAMUSCULAR | Qty: 5 | Status: AC

## 2020-03-29 MED FILL — Electrolyte-R (PH 7.4) Solution: INTRAVENOUS | Qty: 4000 | Status: AC

## 2020-03-29 MED FILL — Heparin Sodium (Porcine) Inj 1000 Unit/ML: INTRAMUSCULAR | Qty: 10 | Status: AC

## 2020-03-29 MED FILL — Sodium Chloride IV Soln 0.9%: INTRAVENOUS | Qty: 3000 | Status: AC

## 2020-03-29 MED FILL — Albumin, Human Inj 5%: INTRAVENOUS | Qty: 250 | Status: AC

## 2020-03-29 MED FILL — Sodium Bicarbonate IV Soln 8.4%: INTRAVENOUS | Qty: 50 | Status: AC

## 2020-03-29 NOTE — Progress Notes (Signed)
      [address]       [name] [address]             [phone]      3 Days Post-Op Procedure(s) (LRB): CORONARY ARTERY BYPASS GRAFTING (CABG) x 4 , using left internal mammary artery, open left radial artery harvest, and left greater saphenous vein harvested endoscopically.  LIMA TO LAD, SVG TO PD, SVG TO DIAG. RADIAL TO OM (N/A) OPEN RADIAL ARTERY HARVEST (N/A) TRANSESOPHAGEAL ECHOCARDIOGRAM (TEE) (N/A) ENDOVEIN HARVEST OF GREATER SAPHENOUS VEIN (Left)   Subjective:  Patient doing pretty well considering the surgery he had.  He is having difficulty coughing up sputum.  + ambulation   No BM yet  Objective: Vital signs in last 24 hours: Temp:  [98 F (36.7 C)-98.7 F (37.1 C)] 98.5 F (36.9 C) (11/25 0409) Pulse Rate:  [72-92] 92 (11/25 0409) Cardiac Rhythm: Normal sinus rhythm (11/25 0219) Resp:  [18-24] 20 (11/25 0409) BP: (106-141)/(66-98) 132/80 (11/25 0409) SpO2:  [91 %-98 %] 95 % (11/25 0409) FiO2 (%):  [28 %] 28 % (11/24 1050) Weight:  [063 kg] 115 kg (11/25 0158)  Intake/Output from previous day: 11/24 0701 - 11/25 0700 In: 3 [I.V.:3] Out: -   General appearance: alert, cooperative and no distress Heart: regular rate and rhythm Lungs: clear to auscultation bilaterally Abdomen: soft, non-tender; bowel sounds normal; no masses,  no organomegaly Extremities: edema trace Wound: clean and dry  Lab Results: Recent Labs    03/28/20 0405 03/29/20 0352  WBC 13.8* 11.4*  HGB 11.7* 11.4*  HCT 36.0* 34.5*  PLT 113* 122*   BMET:  Recent Labs    03/28/20 0405 03/29/20 0352  NA 134* 138  K 4.4 4.1  CL 100 101  CO2 27 27  GLUCOSE 134* 117*  BUN 15 13  CREATININE 0.72 0.81  CALCIUM 8.7* 8.7*    PT/INR:  Recent Labs    03/26/20 1351  LABPROT 18.6*  INR 1.6*   ABG    Component Value Date/Time   PHART 7.323 (L) 03/26/2020 1705   HCO3 23.1 03/26/2020 1705   TCO2 24 03/26/2020 1705   ACIDBASEDEF 3.0 (H) 03/26/2020 1705   O2SAT  96.0 03/26/2020 1705   CBG (last 3)  Recent Labs    03/28/20 1638 03/28/20 2120 03/29/20 0631  GLUCAP 118* 113* 119*    Assessment/Plan: S/P Procedure(s) (LRB): CORONARY ARTERY BYPASS GRAFTING (CABG) x 4 , using left internal mammary artery, open left radial artery harvest, and left greater saphenous vein harvested endoscopically.  LIMA TO LAD, SVG TO PD, SVG TO DIAG. RADIAL TO OM (N/A) OPEN RADIAL ARTERY HARVEST (N/A) TRANSESOPHAGEAL ECHOCARDIOGRAM (TEE) (N/A) ENDOVEIN HARVEST OF GREATER SAPHENOUS VEIN (Left)  1. CV- NSR, BP stable- continue Lopressor, Imdur for radial artery graft, Cozaar 2. Pulm- no acute issues, complains of difficulty expectorating sputum.. ?rash with use of cough syrup.. will try Mucinex, benadryl ordered prn for itching/rash.. if develops will discontinue use 3. Renal- creatinine WNL, weight is minimally elevated, will give Lasix 40 mg x 3 doses, supplement K 4. CBGs controlled, patient isn't a diabetic will d/c SSIP 5. GI- lactulose prn for constipation 6. Dispo- patient stable, will d/c EPW today, start gentle diuresis, monitor if patient remains stable, will plan to d/c home tomorrow   LOS: 6 days    Lowella Dandy, PA-C 03/29/2020

## 2020-03-29 NOTE — Progress Notes (Addendum)
Pt helped to brush teeth at sink. Able to rise independently while maintaining sternal precautions.  EPW d/c'd per order and per protocol. Pt educated on need for bedrest for one hour and q74m vitals. Incisions undressed and painted with iodine. Left open to air. Call bell and bedside table within reach. Will continue to follow.   Reynold Bowen, RN BSN 03/29/2020 9:12 AM

## 2020-03-29 NOTE — Progress Notes (Signed)
Assisted Pt to ambulate in hall using RW.  HR up to 110, no complaints.  Walked whole hallway then to recliner, ordering lunch.  Will cont plan of care.

## 2020-03-29 NOTE — Progress Notes (Signed)
Pt did 3rd walk independently w/ rolling walker, tolerated well.

## 2020-03-30 MED ORDER — OXYCODONE HCL 5 MG PO TABS: 5.0000 mg | ORAL_TABLET | ORAL | 0 refills | Status: DC | PRN

## 2020-03-30 MED ORDER — NICOTINE 21 MG/24HR TD PT24
21.0000 mg | MEDICATED_PATCH | Freq: Every day | TRANSDERMAL | 0 refills | Status: DC
Start: 2020-03-30 — End: 2020-05-29

## 2020-03-30 MED ORDER — GUAIFENESIN ER 600 MG PO TB12: 600.0000 mg | ORAL_TABLET | Freq: Two times a day (BID) | ORAL | Status: DC | PRN

## 2020-03-30 MED ORDER — METOPROLOL SUCCINATE ER 50 MG PO TB24
50.0000 mg | ORAL_TABLET | Freq: Every day | ORAL | 1 refills | Status: DC
Start: 2020-03-30 — End: 2020-07-30

## 2020-03-30 MED ORDER — POTASSIUM CHLORIDE CRYS ER 20 MEQ PO TBCR
20.0000 meq | EXTENDED_RELEASE_TABLET | Freq: Every day | ORAL | 0 refills | Status: DC
Start: 2020-03-30 — End: 2020-05-01

## 2020-03-30 MED ORDER — METOPROLOL SUCCINATE ER 50 MG PO TB24
50.0000 mg | ORAL_TABLET | Freq: Every day | ORAL | Status: DC
  Administered 2020-03-30: 50 mg via ORAL
  Filled 2020-03-30: qty 1

## 2020-03-30 MED ORDER — ISOSORBIDE MONONITRATE ER 30 MG PO TB24
15.0000 mg | ORAL_TABLET | Freq: Every day | ORAL | 0 refills | Status: DC
Start: 2020-03-30 — End: 2020-05-01

## 2020-03-30 MED ORDER — FUROSEMIDE 40 MG PO TABS
40.0000 mg | ORAL_TABLET | Freq: Every day | ORAL | 0 refills | Status: DC
Start: 2020-03-30 — End: 2020-05-01

## 2020-03-30 NOTE — Progress Notes (Signed)
      [address]       [place] [address]             [phone]        4 Days Post-Op Procedure(s) (LRB): CORONARY ARTERY BYPASS GRAFTING (CABG) x 4 , using left internal mammary artery, open left radial artery harvest, and left greater saphenous vein harvested endoscopically.  LIMA TO LAD, SVG TO PD, SVG TO DIAG. RADIAL TO OM (N/A) OPEN RADIAL ARTERY HARVEST (N/A) TRANSESOPHAGEAL ECHOCARDIOGRAM (TEE) (N/A) ENDOVEIN HARVEST OF GREATER SAPHENOUS VEIN (Left)  Subjective: Patient took Mucinex (without Dextromorphan) and did not get a rash. He had multiple bowel movements yesterday.  Objective: Vital signs in last 24 hours: Temp:  [97.7 F (36.5 C)-99 F (37.2 C)] 98.6 F (37 C) (11/26 0452) Pulse Rate:  [87-93] 90 (11/26 0452) Cardiac Rhythm: Normal sinus rhythm (11/25 1900) Resp:  [18-24] 20 (11/26 0452) BP: (108-138)/(58-80) 134/80 (11/26 0452) SpO2:  [96 %-98 %] 97 % (11/26 0452) Weight:  [113.6 kg] 113.6 kg (11/26 0201)  Pre op weight 112.8 kg Current Weight  03/30/20 113.6 kg       Intake/Output from previous day: 11/25 0701 - 11/26 0700 In: 240 [P.O.:240] Out: -    Physical Exam:  Cardiovascular: Slightly tachy with HR in the low 100's Pulmonary: Clear to auscultation on the right and slightly diminished left base Abdomen: Soft, non tender, bowel sounds present. Extremities: Trace bilateral lower extremity edema. Wounds: Clean and dry.  No erythema or signs of infection.  Lab Results: CBC: Recent Labs    03/28/20 0405 03/29/20 0352  WBC 13.8* 11.4*  HGB 11.7* 11.4*  HCT 36.0* 34.5*  PLT 113* 122*   BMET:  Recent Labs    03/28/20 0405 03/29/20 0352  NA 134* 138  K 4.4 4.1  CL 100 101  CO2 27 27  GLUCOSE 134* 117*  BUN 15 13  CREATININE 0.72 0.81  CALCIUM 8.7* 8.7*    PT/INR:  Lab Results  Component Value Date   INR 1.6 (H) 03/26/2020   INR 1.1 03/25/2020   INR 1.1 03/16/2019   ABG:  INR: Will add last result  for INR, ABG once components are confirmed Will add last 4 CBG results once components are confirmed  Assessment/Plan:  1. CV - SR. On Toprol XL 25 mg daily, Losartan 25 mg daily, and Imdur 15 mg daily. Will increase Toprol XL to 50 mg daily for better HR/BP control. 2.  Pulmonary - On room air. Encourage incentive spirometer. 3. Volume Overload - On Lasix 40 mg daily 4.  Expected post op acute blood loss anemia - Last H and H 11.4 and 34.5 5. CBGs 119/74/99. Pre op HGA1C 5.6. 6. Remove sutures 7. Stop stool softeners  8. Discharge  Tyrek Lawhorn M ZimmermanPA-C 03/30/2020,7:01 AM

## 2020-03-30 NOTE — Progress Notes (Signed)
[phone] Discussed with pt the importance of using IS at home. Reviewed sternal precautions and staying in the tube. Gave heart healthy diet and encouraged to follow. Gave written walking instructions for ex. Pt thinks he has walker at home if needed. Discussed smoking cessation and gave handout. Pt stated he has quit. Encouraged to call [phone] if needed. Discussed CRP 2 and will fax referral letter to [facility]. Pt voiced understanding of ed. Luetta Nutting RN BSN 03/30/2020 8:25 AM

## 2020-03-30 NOTE — Progress Notes (Signed)
Discharge instructions provided to patient. All questions answered. IV removed. Patient to be escorted home by his mother.   Allegra Grana RN

## 2020-04-01 ENCOUNTER — Other Ambulatory Visit: Payer: Self-pay | Admitting: Physician Assistant

## 2020-04-04 ENCOUNTER — Telehealth (HOSPITAL_COMMUNITY): Payer: Self-pay

## 2020-04-04 NOTE — Telephone Encounter (Signed)
Faxed cardiac rehab referral to [facility] cardiac rehab per Phase I.

## 2020-04-06 ENCOUNTER — Telehealth: Payer: Self-pay | Admitting: Internal Medicine

## 2020-04-06 NOTE — Telephone Encounter (Signed)
He can get his COVID vaccine as soon as he would like

## 2020-04-06 NOTE — Telephone Encounter (Signed)
Patient states he had quadruple bipass surgery and wants to know when he can get his covid shot.

## 2020-04-06 NOTE — Telephone Encounter (Signed)
Spoke with patient and informed him of Pharmacist recommendation. Understanding verbalized.

## 2020-04-10 ENCOUNTER — Telehealth: Payer: Self-pay | Admitting: Internal Medicine

## 2020-04-10 ENCOUNTER — Telehealth: Payer: Self-pay

## 2020-04-10 NOTE — Telephone Encounter (Signed)
Patient contacted the office requesting to sleep in the bed.  He is s/p CABG x4 with Dr. Dorris Fetch 03/26/20.  Advised that he could sleep anywhere that is comfortable.  Advised against sleeping on his stomach for the time being as he said that is the way he sleeps best.  He acknowledged receipt.

## 2020-04-10 NOTE — Telephone Encounter (Signed)
Patient is states he has to complete a credit disability form for finance coverage with his gap insurance on his motorcycle. He states a form has been faxed to our office, to be completed by Dr. Rennis Golden. I confirmed our fax number with the patient. He states he would like a call back when updates when we receive the paperwork.

## 2020-04-10 NOTE — Telephone Encounter (Signed)
Spoke with pt, aware dr Rennis Golden and his nurse are not in the office today but will have them keep a watch out for the paperwork.

## 2020-04-10 NOTE — Telephone Encounter (Signed)
   Pt would like to ask Dr. Rennis Golden when he can lay down on the bed, his surgery was in 11/22 and since then he's been sleeping on a recliner. He also want to ask until when he needs to use the little pillow that he got from the hospital

## 2020-04-10 NOTE — Telephone Encounter (Signed)
Spoke with pt, he was told at discharge from the hosp as lying flat hurt his chest. Patient told it is okay to lie on his back if it does not hurt. He was also referred to the surgeons office for specific restrictions. He was also told he can use the pillow as needed for help with pain at the site.

## 2020-04-17 ENCOUNTER — Telehealth: Payer: Self-pay

## 2020-04-17 NOTE — Telephone Encounter (Signed)
FMLA/Attending Physician's statement was completed and faxed to [phone]/ [employer] finance attention Brandie. per pt. Beginning leave 03/23/20 through approx. RTW date 06/25/2020

## 2020-04-20 ENCOUNTER — Other Ambulatory Visit: Payer: Self-pay | Admitting: Thoracic Surgery (Cardiothoracic Vascular Surgery)

## 2020-04-20 DIAGNOSIS — Z951 Presence of aortocoronary bypass graft: Secondary | ICD-10-CM

## 2020-04-20 NOTE — Telephone Encounter (Signed)
Attending Physician's Statement of Disability form completed by MD - faxed form + last office note to [employer] @ [phone]

## 2020-04-24 ENCOUNTER — Encounter: Payer: Self-pay | Admitting: Physician Assistant

## 2020-04-24 ENCOUNTER — Ambulatory Visit (INDEPENDENT_AMBULATORY_CARE_PROVIDER_SITE_OTHER): Payer: BLUE CROSS/BLUE SHIELD | Admitting: Physician Assistant

## 2020-04-24 ENCOUNTER — Other Ambulatory Visit: Payer: Self-pay

## 2020-04-24 VITALS — BP 100/58 | HR 78 | Ht 70.0 in | Wt 236.0 lb

## 2020-04-24 DIAGNOSIS — I10 Essential (primary) hypertension: Secondary | ICD-10-CM

## 2020-04-24 DIAGNOSIS — E785 Hyperlipidemia, unspecified: Secondary | ICD-10-CM

## 2020-04-24 DIAGNOSIS — I25810 Atherosclerosis of coronary artery bypass graft(s) without angina pectoris: Secondary | ICD-10-CM

## 2020-04-24 MED ORDER — NITROGLYCERIN 0.4 MG SL SUBL: 0.4000 mg | SUBLINGUAL_TABLET | SUBLINGUAL | 2 refills | Status: AC | PRN

## 2020-04-24 NOTE — Patient Instructions (Addendum)
Medication Instructions:  The current medical regimen is effective;  continue present plan and medications as directed. Please refer to the Current Medication list given to you today. *If you need a refill on your cardiac medications before your next appointment, please call your pharmacy*  Lab Work:   Testing/Procedures:  NONE    NONE  Follow-Up: Your next appointment:  3 month(s) In Person with K. Italy Hilty, MD OR IF UNAVAILABLE HAO MENG, PA-C   At [facility], you and your health needs are our priority.  As part of our continuing mission to provide you with exceptional heart care, we have created designated Provider Care Teams.  These Care Teams include your primary Cardiologist (physician) and Advanced Practice Providers (APPs -  Physician Assistants and Nurse Practitioners) who all work together to provide you with the care you need, when you need it.  We recommend signing up for the patient portal called "MyChart".  Sign up information is provided on this After Visit Summary.  MyChart is used to connect with patients for Virtual Visits (Telemedicine).  Patients are able to view lab/test results, encounter notes, upcoming appointments, etc.  Non-urgent messages can be sent to your provider as well.   To learn more about what you can do with MyChart, go to [email or web address].

## 2020-04-24 NOTE — Progress Notes (Signed)
Cardiology Office Note:    Date:  04/26/2020   ID:  [name], DOB October 06, 1975, MRN [id number]  PCP:  Elfredia Nevins, MD  [facility] Cardiologist:  Chrystie Nose, MD  [facility] Electrophysiologist:  None   Referring MD: Elfredia Nevins, MD   Chief Complaint  Patient presents with  . Follow-up    Seen for Dr. Rennis Golden    History of Present Illness:    Marc Johnston is a 44 y.o. male with a hx of hypertension, obstructive sleep apnea, depression, and recently diagnosed CAD.  Patient was seen by Dr. Rennis Golden in November 2021 who referred him for coronary CT.  A coronary CT came back abnormal showing moderate to severe proximal LAD stenosis, coronary calcium score 79 which placed the patient in the 93rd percentile for age and sex matched control. Cardiac catheterization performed on 03/23/2020 showed severe multivessel CAD. Echocardiogram obtained on 03/24/2020 showed EF 50 to 55%, no significant valve disease.  Patient ultimately underwent CABG x4 with LIMA to LAD, left radial artery to OM 3, SVG to D1, SVG to posterior descending artery by Dr. Dorris Fetch on 03/26/2020.  Toprol-XL was uptitrated to 50 mg daily due to tachycardia and hypertension.  Patient has been doing well after the recent bypass surgery.  He denies any significant chest pain or shortness of breath.  He has been recovering well.  He has finished the 5-day course of Lasix and potassium supplement.  He is on 325 mg aspirin, low-dose Imdur, losartan, metoprolol and Crestor.  Recent lipid panel shows very well controlled LDL.  Hemoglobin A1c was 5.6.  His surgical wound is also very well-healed as well.  He can follow-up with Dr. Rennis Golden in 2 to 3 months.  Past Medical History:  Diagnosis Date  . Allergy   . Complication of anesthesia    wake up aggressive if confused about where he is  . Depression   . Hypertension    HCTZ 2005-2008.  [name] OSA (obstructive sleep apnea) 05/05/2006   Mild.  No CPAP warranted.  .  Periapical granuloma    left mandible lateral per CT 07/2014    Past Surgical History:  Procedure Laterality Date  . CERVICAL DISC ARTHROPLASTY N/A 03/21/2019   Procedure: CERVICAL ANTERIOR DISC ARTHROPLASTY C6-7;  Surgeon: Lucy Chris, MD;  Location: [facility] ORS;  Service: Neurosurgery;  Laterality: N/A;  . CORONARY ARTERY BYPASS GRAFT N/A 03/26/2020   Procedure: CORONARY ARTERY BYPASS GRAFTING (CABG) x 4 , using left internal mammary artery, open left radial artery harvest, and left greater saphenous vein harvested endoscopically.  LIMA TO LAD, SVG TO PD, SVG TO DIAG. RADIAL TO OM;  Surgeon: [name], MD;  [facility];  Service: Open Heart Surgery;  Laterality: N/A;  . ENDOVEIN HARVEST OF GREATER SAPHENOUS VEIN Left 03/26/2020   Procedure: ENDOVEIN HARVEST OF GREATER SAPHENOUS VEIN;  Surgeon: [name], MD;  Location: [facility] OR;  Service: Open Heart Surgery;  Laterality: Left;  . FINGER SURGERY Right    as a child  reattached 2nd finger   . INTRAVASCULAR PRESSURE WIRE/FFR STUDY N/A 03/23/2020   Procedure: INTRAVASCULAR PRESSURE WIRE/FFR STUDY;  Surgeon: Yvonne Kendall, MD;  Location: [facility] INVASIVE CV LAB;  Service: Cardiovascular;  Laterality: N/A;  cfx  . LEFT HEART CATH AND CORONARY ANGIOGRAPHY N/A 03/23/2020   Procedure: LEFT HEART CATH AND CORONARY ANGIOGRAPHY;  Surgeon: Yvonne Kendall, MD;  Location: [facility] INVASIVE CV LAB;  Service: Cardiovascular;  Laterality: N/A;  . NASAL SINUS  SURGERY    . RADIAL ARTERY HARVEST N/A 03/26/2020   Procedure: OPEN RADIAL ARTERY HARVEST;  Surgeon: Loreli SlotHendrickson, Steven C, MD;  [facility];  Service: Open Heart Surgery;  Laterality: N/A;  . TEE WITHOUT CARDIOVERSION N/A 03/26/2020   Procedure: TRANSESOPHAGEAL ECHOCARDIOGRAM (TEE);  Surgeon: Loreli SlotHendrickson, Steven C, MD;  [facility];  Service: Open Heart Surgery;  Laterality: N/A;    Current Medications: Current Meds  Medication Sig  . acetaminophen (TYLENOL) 500 MG tablet  Take 1,000 mg by mouth every 6 (six) hours as needed for mild pain, fever or headache.  [name]. aspirin EC 325 MG EC tablet Take 1 tablet (325 mg total) by mouth daily.  . furosemide (LASIX) 40 MG tablet Take 1 tablet (40 mg total) by mouth daily. For 5 days then stop.  [name]. guaiFENesin (MUCINEX) 600 MG 12 hr tablet Take 1 tablet (600 mg total) by mouth 2 (two) times daily as needed for to loosen phlegm.  . isosorbide mononitrate (IMDUR) 30 MG 24 hr tablet Take 0.5 tablets (15 mg total) by mouth daily. For one month then stop.  [name]. losartan (COZAAR) 25 MG tablet Take 25 mg by mouth daily.  . metoprolol succinate (TOPROL-XL) 50 MG 24 hr tablet Take 1 tablet (50 mg total) by mouth daily. Take with or immediately following a meal.  . nicotine (NICODERM CQ - DOSED IN MG/24 HOURS) 21 mg/24hr patch Place 1 patch (21 mg total) onto the skin daily.  [name]. oxyCODONE (OXY IR/ROXICODONE) 5 MG immediate release tablet Take 1 tablet (5 mg total) by mouth every 4 (four) hours as needed for severe pain.  . potassium chloride SA (KLOR-CON) 20 MEQ tablet Take 1 tablet (20 mEq total) by mouth daily. For 5 days then stop.  . rosuvastatin (CRESTOR) 10 MG tablet Take 10 mg by mouth at bedtime.     Allergies:   Morphine and Other   Social History   Socioeconomic History  . Marital status: Legally Separated    Spouse name: Not on file  . Number of children: Not on file  . Years of education: Not on file  . Highest education level: Not on file  Occupational History  . Not on file  Tobacco Use  . Smoking status: Current Every Day Smoker  . Smokeless tobacco: Never Used  Vaping Use  . Vaping Use: Former  Substance and Sexual Activity  . Alcohol use: Yes    Alcohol/week: 0.0 standard drinks    Comment: are  . Drug use: No  . Sexual activity: Yes    Partners: Female  Other Topics Concern  . Not on file  Social History Narrative   Marital status: married      Children:  Two children (3, 2)      Lives: with wife, two  children, stepson.      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        Tobacco:  Quit smoking 2008.      Alcohol: none      Drugs: none      Exercise: none         Social Determinants of Health   Financial Resource Strain: Not on file  Food Insecurity: Not on file  Transportation Needs: Not on file  Physical Activity: Not on file  Stress: Not on file  Social Connections: Not on file     Family History: The patient's family history includes Diabetes in his father; Heart disease in his father and mother.  ROS:   Please see the history of present illness.     All other systems reviewed and are negative.  EKGs/Labs/Other Studies Reviewed:    The following studies were reviewed today:  Cath 03/23/2020 Conclusions: 1. Severe, multivessel coronary artery disease, as detailed below. 2. Normal left ventricular systolic function with mildly elevated filling pressure.  Recommendations: 1. Admit for cardiac surgery consultation for consideration of CABG, given multivessel CAD including proximal LAD/D1 involvement.  LAD and RCA could be treated percutaneously, with medical management of occluded ramus intermedius and non-critical distal LCx disease. 2. Aggressive secondary prevention. 3. Obtain echocardiogram.   Echo 03/24/2020 1. Inferior basal hypokinesis. Left ventricular ejection fraction, by  estimation, is 50 to 55%. The left ventricle has low normal function. The  left ventricle has no regional wall motion abnormalities. Left ventricular  diastolic parameters were normal.  2. Right ventricular systolic function is normal. The right ventricular  size is normal.  3. Left atrial size was mildly dilated.  4. The mitral valve is normal in structure. Trivial mitral valve  regurgitation. No evidence of mitral stenosis.  5. The aortic valve is tricuspid. Aortic valve regurgitation is not  visualized. No aortic stenosis is present.  6. The inferior vena cava is  normal in size with greater than 50%  respiratory variability, suggesting right atrial pressure of 3 mmHg.   EKG:  EKG is not ordered today.  Recent Labs: 03/24/2020: ALT 35 03/27/2020: Magnesium 2.1 03/29/2020: BUN 13; Creatinine, Ser 0.81; Hemoglobin 11.4; Platelets 122; Potassium 4.1; Sodium 138  Recent Lipid Panel    Component Value Date/Time   CHOL 118 03/24/2020 0214   TRIG 155 (H) 03/24/2020 0214   HDL 29 (L) 03/24/2020 0214   CHOLHDL 4.1 03/24/2020 0214   VLDL 31 03/24/2020 0214   LDLCALC 58 03/24/2020 0214     Risk Assessment/Calculations:       Physical Exam:    VS:  BP (!) 100/58 (BP Location: Right Arm, Patient Position: Sitting, Cuff Size: Normal)   Pulse 78   Ht 5\' 10"  (1.778 m)   Wt 236 lb (107 kg)   BMI 33.86 kg/m     Wt Readings from Last 3 Encounters:  04/24/20 236 lb (107 kg)  03/30/20 250 lb 8 oz (113.6 kg)  03/16/20 249 lb 12.8 oz (113.3 kg)     GEN:  Well nourished, well developed in no acute distress HEENT: Normal NECK: No JVD; No carotid bruits LYMPHATICS: No lymphadenopathy CARDIAC: RRR, no murmurs, rubs, gallops RESPIRATORY:  Clear to auscultation without rales, wheezing or rhonchi  ABDOMEN: Soft, non-tender, non-distended MUSCULOSKELETAL:  No edema; No deformity  SKIN: Warm and dry NEUROLOGIC:  Alert and oriented x 3 PSYCHIATRIC:  Normal affect   ASSESSMENT:    1. Coronary artery disease involving coronary bypass graft of native heart without angina pectoris   2. Primary hypertension   3. Hyperlipidemia LDL goal <70    PLAN:    In order of problems listed above:  1. CAD s/p recent CABG: Denies any recent chest pain.  Sternotomy scar is well-healed.  Continue on high-dose aspirin.  Will prescribe as needed nitroglycerin  2. Hypertension: Blood pressure stable  3. Hyperlipidemia: On Crestor.        Medication Adjustments/Labs and Tests Ordered: Current medicines are reviewed at length with the patient today.  Concerns  regarding medicines are outlined above.  No orders of the defined types were placed in this encounter.  Meds ordered this encounter  Medications  .  nitroGLYCERIN (NITROSTAT) 0.4 MG SL tablet    Sig: Place 1 tablet (0.4 mg total) under the tongue every 5 (five) minutes as needed for chest pain.    Dispense:  25 tablet    Refill:  2    Patient Instructions  Medication Instructions:  The current medical regimen is effective;  continue present plan and medications as directed. Please refer to the Current Medication list given to you today. *If you need a refill on your cardiac medications before your next appointment, please call your pharmacy*  Lab Work:   Testing/Procedures:  NONE    NONE  Follow-Up: Your next appointment:  3 month(s) In Person with K. Italy Hilty, MD OR IF UNAVAILABLE Myking Sar, PA-C   At [facility], you and your health needs are our priority.  As part of our continuing mission to provide you with exceptional heart care, we have created designated Provider Care Teams.  These Care Teams include your primary Cardiologist (physician) and Advanced Practice Providers (APPs -  Physician Assistants and Nurse Practitioners) who all work together to provide you with the care you need, when you need it.  We recommend signing up for the patient portal called "MyChart".  Sign up information is provided on this After Visit Summary.  MyChart is used to connect with patients for Virtual Visits (Telemedicine).  Patients are able to view lab/test results, encounter notes, upcoming appointments, etc.  Non-urgent messages can be sent to your provider as well.   To learn more about what you can do with MyChart, go to [email or web address].        Ramond Dial, [place]  04/26/2020 10:23 PM    [facility]

## 2020-04-26 ENCOUNTER — Encounter: Payer: Self-pay | Admitting: Physician Assistant

## 2020-05-01 ENCOUNTER — Other Ambulatory Visit: Payer: Self-pay

## 2020-05-01 ENCOUNTER — Telehealth: Payer: Self-pay | Admitting: Internal Medicine

## 2020-05-01 ENCOUNTER — Encounter: Payer: Self-pay | Admitting: Thoracic Surgery (Cardiothoracic Vascular Surgery)

## 2020-05-01 ENCOUNTER — Ambulatory Visit
Admission: RE | Admit: 2020-05-01 | Discharge: 2020-05-01 | Disposition: A | Discharge status: Home / Self Care | Payer: BLUE CROSS/BLUE SHIELD | Source: Ambulatory Visit | Attending: Thoracic Surgery (Cardiothoracic Vascular Surgery) | Admitting: Thoracic Surgery (Cardiothoracic Vascular Surgery)

## 2020-05-01 ENCOUNTER — Ambulatory Visit (INDEPENDENT_AMBULATORY_CARE_PROVIDER_SITE_OTHER): Payer: Self-pay | Admitting: Thoracic Surgery (Cardiothoracic Vascular Surgery)

## 2020-05-01 VITALS — BP 132/85 | HR 78 | Temp 98.1°F | Resp 20 | Ht 70.0 in | Wt 241.2 lb

## 2020-05-01 DIAGNOSIS — Z951 Presence of aortocoronary bypass graft: Secondary | ICD-10-CM

## 2020-05-01 NOTE — Telephone Encounter (Signed)
    Pt saw Dr. Dorris Fetch today and was told pt can go back to work soon, however, pt is scheduled for f/u with Dr. Rennis Golden on 07/23/20. He wanted to know if Dr. Rennis Golden can agree to Dr. Dorris Fetch so he can go back to work as soon as possible

## 2020-05-01 NOTE — Progress Notes (Signed)
[address]       Marc Johnston [address]             [phone]    HPI: Marc Johnston returns for a scheduled postoperative follow-up visit  Marc Johnston is a 44 year old man with multiple cardiac risk factors including hypertension, tobacco abuse, and a strong family history of CAD.  Marc Johnston presented with accelerating exertional chest pain.  A coronary CT showed three-vessel disease.  Cardiac catheterization Marc Johnston was found to have severe three-vessel disease with preserved left ventricular function.  Marc Johnston underwent coronary bypass grafting x4 on 03/26/2020.  Marc Johnston did well postoperatively and went home on day 4.  Marc Johnston feels well.  Marc Johnston is not having any significant incisional pain.  Marc Johnston is not taking any narcotics.  Marc Johnston has not had any recurrent angina.  His exercise tolerance is slowly improving.  Marc Johnston did have some pain just distal to his radial artery incision for the first few weeks but that has improved significantly.  Past Medical History:  Diagnosis Date  . Allergy   . Complication of anesthesia    wake up aggressive if confused about where Marc Johnston is  . Depression   . Hypertension    HCTZ 2005-2008.  [name] OSA (obstructive sleep apnea) 05/05/2006   Mild.  No CPAP warranted.  . Periapical granuloma    left mandible lateral per CT 07/2014    Current Outpatient Medications  Medication Sig Dispense Refill  . acetaminophen (TYLENOL) 500 MG tablet Take 1,000 mg by mouth every 6 (six) hours as needed for mild pain, fever or headache.    [name] aspirin EC 325 MG EC tablet Take 1 tablet (325 mg total) by mouth daily. 30 tablet 0  . guaiFENesin (MUCINEX) 600 MG 12 hr tablet Take 1 tablet (600 mg total) by mouth 2 (two) times daily as needed for to loosen phlegm.    [name] losartan (COZAAR) 25 MG tablet Take 25 mg by mouth daily.    . metoprolol succinate (TOPROL-XL) 50 MG 24 hr tablet Take 1 tablet (50 mg total) by mouth daily. Take with or immediately following a meal. 60 tablet 1  . nicotine (NICODERM  CQ - DOSED IN MG/24 HOURS) 21 mg/24hr patch Place 1 patch (21 mg total) onto the skin daily. 28 patch 0  . nitroGLYCERIN (NITROSTAT) 0.4 MG SL tablet Place 1 tablet (0.4 mg total) under the tongue every 5 (five) minutes as needed for chest pain. 25 tablet 2  . oxyCODONE (OXY IR/ROXICODONE) 5 MG immediate release tablet Take 1 tablet (5 mg total) by mouth every 4 (four) hours as needed for severe pain. 30 tablet 0  . rosuvastatin (CRESTOR) 10 MG tablet Take 10 mg by mouth at bedtime.     No current facility-administered medications for this visit.    Physical Exam BP 132/85 (BP Location: Right Arm, Patient Position: Sitting)   Pulse 78   Temp 98.1 F (36.7 C)   Resp 20   Ht 5\' 10"  (1.778 m)   Wt 241 lb 3.2 oz (109.4 kg)   SpO2 96% Comment: RA with mask on  BMI 34.42 kg/m  44 year old man in no acute distress Alert and oriented x3 with no focal deficits Sternal incision clean dry and intact, sternum stable Cardiac regular rate and rhythm with normal S1 and S2 with no rubs or murmurs No peripheral edema Left arm incision intact with minimal eschar near elbow, some numbness to light touch lateral aspect Lungs clear  with equal breath sounds bilaterally  Diagnostic Tests: CHEST - 2 VIEW  COMPARISON:  November 24, 21.  FINDINGS: The heart size and mediastinal contours are within normal limits. CABG with median sternotomy. No consolidation. No visible pleural effusions or pneumothorax. No acute osseous abnormality. Prior cervical spine fusion, partially imaged.  IMPRESSION: No active cardiopulmonary disease.   Electronically Signed   By: Feliberto Harts MD   On: 05/01/2020 09:39 I personally reviewed the chest x-ray images and concur with the findings noted above  Impression: Marc Johnston is a 44 year old man with multiple cardiac risk factors who presented with accelerating angina.  Marc Johnston was found to have three-vessel disease with preserved left-ventricular function.  On  03/26/2020 Marc Johnston underwent coronary bypass grafting x4 using left radial in addition to the mammary and saphenous vein.  Marc Johnston is doing well at this point in time.  Marc Johnston has minimal discomfort.  Marc Johnston is not taking any narcotics.  Marc Johnston has not had any recurrent angina.  Marc Johnston may begin driving.  Appropriate precautions were discussed.  Marc Johnston should not ride a motorcycle until his been at least 8 weeks from his surgery.  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  Plan: Follow-up with Dr. Rennis Golden and Dr. Sherwood Gambler I will be happy to see Marc Johnston back anytime in the future if I can be of any further assistance with his care  [name], MD [facility] [phone]

## 2020-05-01 NOTE — Telephone Encounter (Signed)
Returned call to pt he states that he saw Dr Dorris Fetch today and he told him that he could go back to work in the beginning of February. Is this ok to return to work? He said that we told him to remain out of work until end of February.

## 2020-05-07 NOTE — Telephone Encounter (Signed)
Spoke to patient he stated he would like a sooner appointment with Dr.Hilty to get a letter to return to work.Stated he recently saw Dr.Hendrickson who advised him if ok with Dr.Hilty to return to work 06/05/20.Appointment scheduled with Dr.Hilty 05/29/20 at 2:30 pm to discuss.

## 2020-05-07 NOTE — Telephone Encounter (Signed)
Patient is following up regarding clearance to return to work. Please return call to discuss.

## 2020-05-08 NOTE — Telephone Encounter (Signed)
Ok thanks ... will d/w him at follow-up. Return to work is based on surgeon's recommendations.  Dr Rexene Edison

## 2020-05-22 ENCOUNTER — Other Ambulatory Visit: Payer: Self-pay | Admitting: Physician Assistant

## 2020-05-26 ENCOUNTER — Other Ambulatory Visit: Payer: Self-pay | Admitting: Physician Assistant

## 2020-05-29 ENCOUNTER — Encounter: Payer: Self-pay | Admitting: Internal Medicine

## 2020-05-29 ENCOUNTER — Ambulatory Visit (INDEPENDENT_AMBULATORY_CARE_PROVIDER_SITE_OTHER): Payer: BLUE CROSS/BLUE SHIELD | Admitting: Internal Medicine

## 2020-05-29 ENCOUNTER — Other Ambulatory Visit: Payer: Self-pay

## 2020-05-29 ENCOUNTER — Encounter: Payer: Self-pay | Admitting: *Deleted

## 2020-05-29 VITALS — BP 110/60 | HR 67 | Ht 70.0 in | Wt 236.4 lb

## 2020-05-29 DIAGNOSIS — Z951 Presence of aortocoronary bypass graft: Secondary | ICD-10-CM | POA: Diagnosis not present

## 2020-05-29 DIAGNOSIS — I25810 Atherosclerosis of coronary artery bypass graft(s) without angina pectoris: Secondary | ICD-10-CM | POA: Diagnosis not present

## 2020-05-29 DIAGNOSIS — E785 Hyperlipidemia, unspecified: Secondary | ICD-10-CM | POA: Diagnosis not present

## 2020-05-29 DIAGNOSIS — I10 Essential (primary) hypertension: Secondary | ICD-10-CM | POA: Diagnosis not present

## 2020-05-29 NOTE — Progress Notes (Signed)
OFFICE CONSULT NOTE  Chief Complaint:  Chest pain  Primary Care Physician: Elfredia Nevins, MD  HPI:  Marc Johnston is a 45 y.o. male who is being seen today for the evaluation of chest pain at the request of Elfredia Nevins, MD.  This is a pleasant 45 year old male kindly referred for evaluation management of chest pain.  He has a past medical history significant for hypertension, mild obstructive sleep apnea, not on CPAP, depression and family history of heart disease in his mother.  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  Recently he has been describing some chest pain.  He noted it to be worse when deploying the "landing gear" on his trailer hitch.  He also notes it when doing certain activities and feels that the chest pain radiates across his chest.  He has been struggling with right shoulder pain and has a history of cervical pain status post surgery as well as low back pain.  He says he does not get short of breath or have chest pain with exertion or sexual activity.  EKG today shows a normal sinus rhythm at 73 with lateral and high lateral ST depression and T wave changes (compared to a prior study in 2017 this is unchanged).  Lab work from October showed total cholesterol 211, HDL 37, LDL 138 and triglycerides 778.  A1c of 5.8.  At the time the labs were drawn and he saw his PCP he was started on low-dose aspirin, 20 mg atorvastatin and losartan 25 mg.  Pressure today was 142/96.  05/29/2020  Marc Johnston returns today for follow-up.  He underwent four-vessel CABG in November 2021 with LIMA to LAD, left radial to OM 3 and SVG to first diagonal and SVG to PDA.  He tolerated this well and is chest pain-free.  He continues to improve.  He has managed to stop smoking.  He is participating cardiac rehabilitation.  Early follow-up on December 28 and was noted to be doing well.  He was told he could resume driving.  He was advised to restrict  motorcycle riding until 8 weeks after surgery.  He wishes now to get back to work.  PMHx:  Past Medical History:  Diagnosis Date  . Allergy   . Complication of anesthesia    wake up aggressive if confused about where he is  . Depression   . Hypertension    HCTZ 2005-2008.  [name] OSA (obstructive sleep apnea) 05/05/2006   Mild.  No CPAP warranted.  . Periapical granuloma    left mandible lateral per CT 07/2014    Past Surgical History:  Procedure Laterality Date  . CERVICAL DISC ARTHROPLASTY N/A 03/21/2019   Procedure: CERVICAL ANTERIOR DISC ARTHROPLASTY C6-7;  Surgeon: Lucy Chris, MD;  Location: [facility] ORS;  Service: Neurosurgery;  Laterality: N/A;  . CORONARY ARTERY BYPASS GRAFT N/A 03/26/2020   Procedure: CORONARY ARTERY BYPASS GRAFTING (CABG) x 4 , using left internal mammary artery, open left radial artery harvest, and left greater saphenous vein harvested endoscopically.  LIMA TO LAD, SVG TO PD, SVG TO DIAG. RADIAL TO OM;  Surgeon: [name], MD;  Location: [facility] OR;  Service: Open Heart Surgery;  Laterality: N/A;  . ENDOVEIN HARVEST OF GREATER SAPHENOUS VEIN Left 03/26/2020   Procedure: ENDOVEIN HARVEST OF GREATER SAPHENOUS VEIN;  Surgeon: [name], MD;  Location: [facility] OR;  Service: Open Heart Surgery;  Laterality: Left;  . FINGER SURGERY Right  as a child  reattached 2nd finger   . INTRAVASCULAR PRESSURE WIRE/FFR STUDY N/A 03/23/2020   Procedure: INTRAVASCULAR PRESSURE WIRE/FFR STUDY;  Surgeon: Yvonne Kendall, MD;  Location: [facility] INVASIVE CV LAB;  Service: Cardiovascular;  Laterality: N/A;  cfx  . LEFT HEART CATH AND CORONARY ANGIOGRAPHY N/A 03/23/2020   Procedure: LEFT HEART CATH AND CORONARY ANGIOGRAPHY;  Surgeon: Yvonne Kendall, MD;  Location: [facility] INVASIVE CV LAB;  Service: Cardiovascular;  Laterality: N/A;  . NASAL SINUS SURGERY    . RADIAL ARTERY HARVEST N/A 03/26/2020   Procedure: OPEN RADIAL ARTERY HARVEST;  Surgeon: [name], MD;  Location:  [facility] OR;  Service: Open Heart Surgery;  Laterality: N/A;  . TEE WITHOUT CARDIOVERSION N/A 03/26/2020   Procedure: TRANSESOPHAGEAL ECHOCARDIOGRAM (TEE);  Surgeon: [name], MD;  Location: [facility] OR;  Service: Open Heart Surgery;  Laterality: N/A;    FAMHx:  Family History  Problem Relation Age of Onset  . Diabetes Father   . Heart disease Father   . Heart disease Mother     SOCHx:   reports that he quit smoking about 2 months ago. He has never used smokeless tobacco. He reports current alcohol use. He reports that he does not use drugs.  ALLERGIES:  Allergies  Allergen Reactions  . Morphine Other (See Comments)    Severe agitation  . Other Rash    Cough suppressant, unsure which one    ROS: Pertinent items noted in HPI and remainder of comprehensive ROS otherwise negative.  HOME MEDS: Current Outpatient Medications on File Prior to Visit  Medication Sig Dispense Refill  . acetaminophen (TYLENOL) 500 MG tablet Take 1,000 mg by mouth every 6 (six) hours as needed for mild pain, fever or headache.    [name] aspirin EC 325 MG EC tablet Take 1 tablet (325 mg total) by mouth daily. 30 tablet 0  . losartan (COZAAR) 25 MG tablet Take 25 mg by mouth daily.    . metoprolol succinate (TOPROL-XL) 50 MG 24 hr tablet Take 1 tablet (50 mg total) by mouth daily. Take with or immediately following a meal. 60 tablet 1  . nitroGLYCERIN (NITROSTAT) 0.4 MG SL tablet Place 1 tablet (0.4 mg total) under the tongue every 5 (five) minutes as needed for chest pain. 25 tablet 2  . Omega-3 Fatty Acids (FISH OIL) 1000 MG CAPS Take 1 capsule by mouth 2 (two) times daily.    . rosuvastatin (CRESTOR) 10 MG tablet Take 10 mg by mouth at bedtime.     No current facility-administered medications on file prior to visit.    LABS/IMAGING: No results found for this or any previous visit (from the past 48 hour(s)). No results found.  LIPID PANEL:    Component Value Date/Time   CHOL 118 03/24/2020 0214    TRIG 155 (H) 03/24/2020 0214   HDL 29 (L) 03/24/2020 0214   CHOLHDL 4.1 03/24/2020 0214   VLDL 31 03/24/2020 0214   LDLCALC 58 03/24/2020 0214    WEIGHTS: Wt Readings from Last 3 Encounters:  05/29/20 236 lb 6.4 oz (107.2 kg)  05/01/20 241 lb 3.2 oz (109.4 kg)  04/24/20 236 lb (107 kg)    VITALS: BP 110/60   Pulse 67   Ht 5\' 10"  (1.778 m)   Wt 236 lb 6.4 oz (107.2 kg)   BMI 33.92 kg/m   EXAM: General appearance: alert and no distress Neck: no carotid bruit, no JVD and thyroid not enlarged, symmetric, no tenderness/mass/nodules Lungs: clear to auscultation  bilaterally Heart: regular rate and rhythm Abdomen: soft, non-tender; bowel sounds normal; no masses,  no organomegaly Extremities: extremities normal, atraumatic, no cyanosis or edema and Healed left radial artery scar and midline sternotomy incision Pulses: 2+ and symmetric Skin: Skin color, texture, turgor normal. No rashes or lesions Neurologic: Grossly normal Psych: Pleasant  EKG: Normal sinus rhythm with lateral T wave changes at 67-personally reviewed  ASSESSMENT: 1. CAD status post four-vessel bypass (03/2020), LIMA to LAD, free radial to OM 3, SVG to PDA and SVG to D1 Dorris Fetch) 2. Hypertension 3. Dyslipidemia 4. Tobacco abuse - quit 5. Family history of coronary disease  PLAN: 1.   Mr. Mitchum is doing well and is recovering from four-vessel bypass in November.  He is doing cardiac rehab.  He stopped smoking.  He would like to return to work.  We will provide a work excuse to return on Monday, June 04, 2020.  No restrictions.  Plan follow-up with me in 6 months or sooner as necessary.  Chrystie Nose, MD, [facility], FACP  [place]  [facility]  Medical Director of the [facility]  [facility] Diplomate of the American Board of Clinical Lipidology Attending Cardiologist  Direct Dial: [phone]  Fax: [phone]  Website:  [email or web address] Nicely Obie Silos 05/29/2020, 2:54 PM

## 2020-05-29 NOTE — Patient Instructions (Signed)

## 2020-06-05 ENCOUNTER — Telehealth: Payer: Self-pay | Admitting: Internal Medicine

## 2020-06-05 NOTE — Telephone Encounter (Signed)
[facility] received paperwork from [place] on 2/1, and paperwork was given to nurse.

## 2020-06-08 NOTE — Telephone Encounter (Signed)
Contacted patient to come by the office and complete release and billing form.

## 2020-06-26 NOTE — Telephone Encounter (Signed)
Paperwork was faxed to [place] on 2/22.

## 2020-07-23 ENCOUNTER — Ambulatory Visit: Payer: BLUE CROSS/BLUE SHIELD | Admitting: Internal Medicine

## 2020-07-30 ENCOUNTER — Other Ambulatory Visit: Payer: Self-pay | Admitting: Internal Medicine

## 2020-07-30 ENCOUNTER — Other Ambulatory Visit: Payer: Self-pay | Admitting: Physician Assistant

## 2021-03-11 IMAGING — DX DG CHEST 1V PORT
1 series · 1 of 1 positions shown · non-contrast
Comparison: 03/26/2020.

CLINICAL DATA: Chest tube.  Open-heart surgery.

EXAM:
PORTABLE CHEST 1 VIEW

[chest ap]
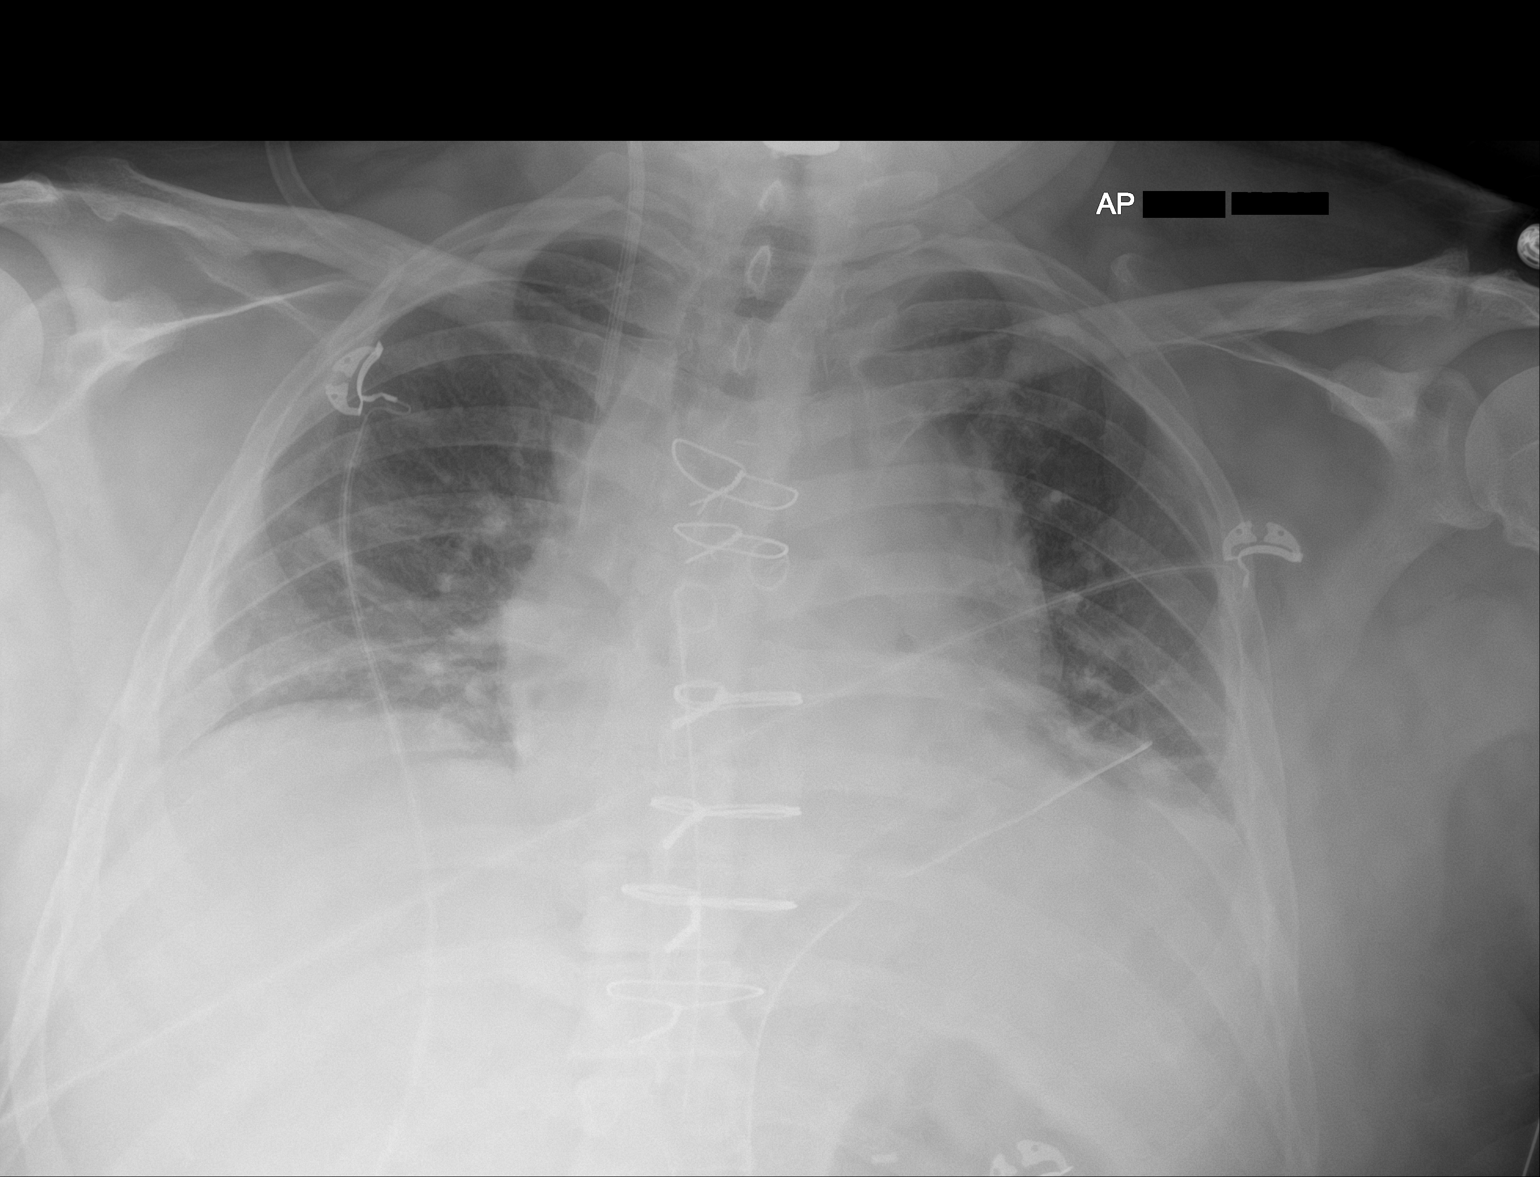

[1 of 1 positions shown; findings below may reference images not displayed]

FINDINGS: Interim extubation removal of NG tube. Right IJ sheath, mediastinal
drainage catheter, left chest tube in stable position. Prior CABG.
Stable cardiomegaly. Low lung volumes with persistent bibasilar
atelectasis. No pleural effusion or pneumothorax. Prior cervical
spine fusion.
IMPRESSION: 1. Interim extubation and removal of NG tube. Right IJ sheath,
mediastinal drainage catheter, left chest tube in stable position.
No pneumothorax.
2. Prior CABG. Stable cardiomegaly.
3. Low lung volumes persistent bibasilar atelectasis.

## 2021-04-15 IMAGING — CR DG CHEST 2V
2 series · 2 of 2 positions shown · non-contrast
Comparison: March 28, 20.

CLINICAL DATA: History of CABG.

EXAM:
CHEST - 2 VIEW

[w chest pa]
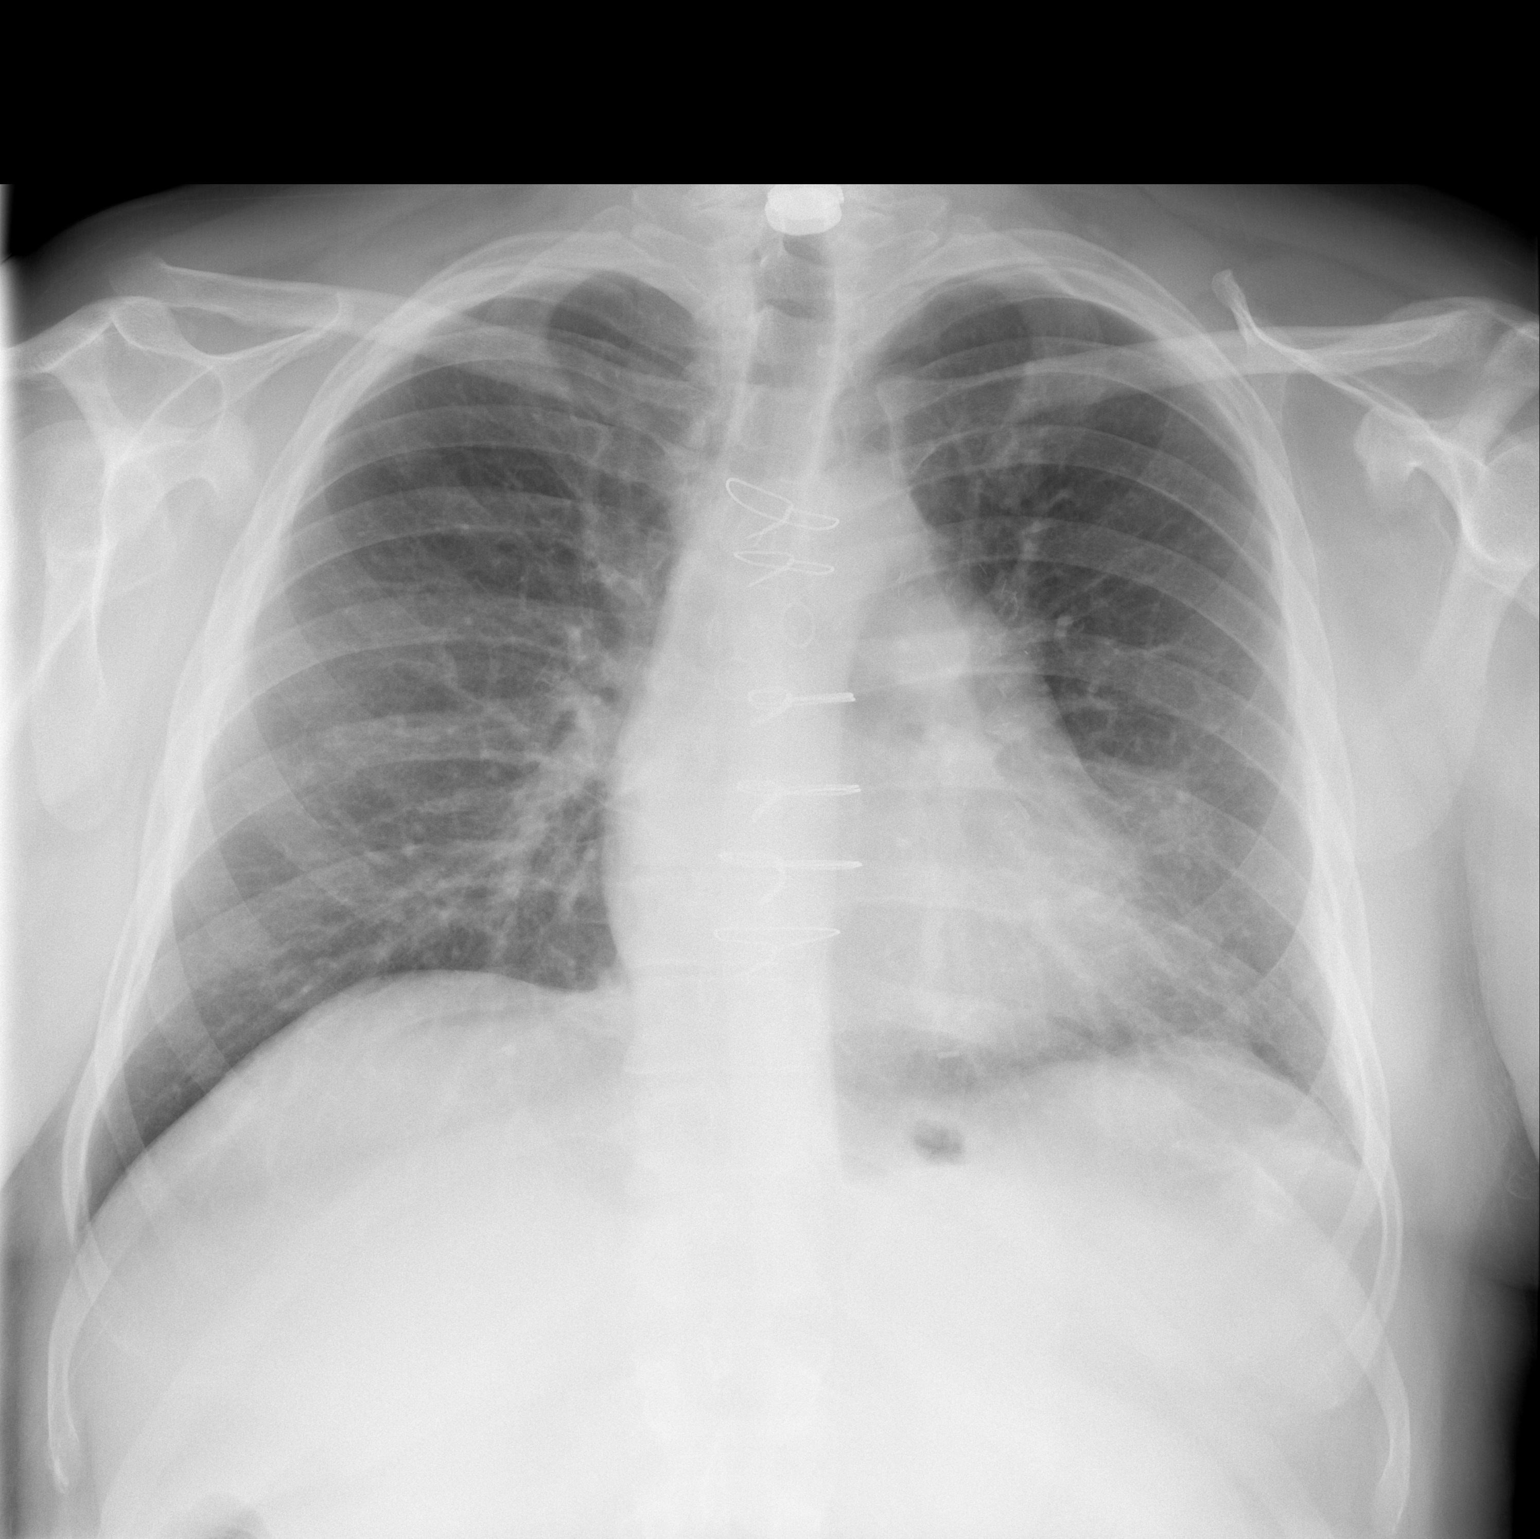

[w chest lat]
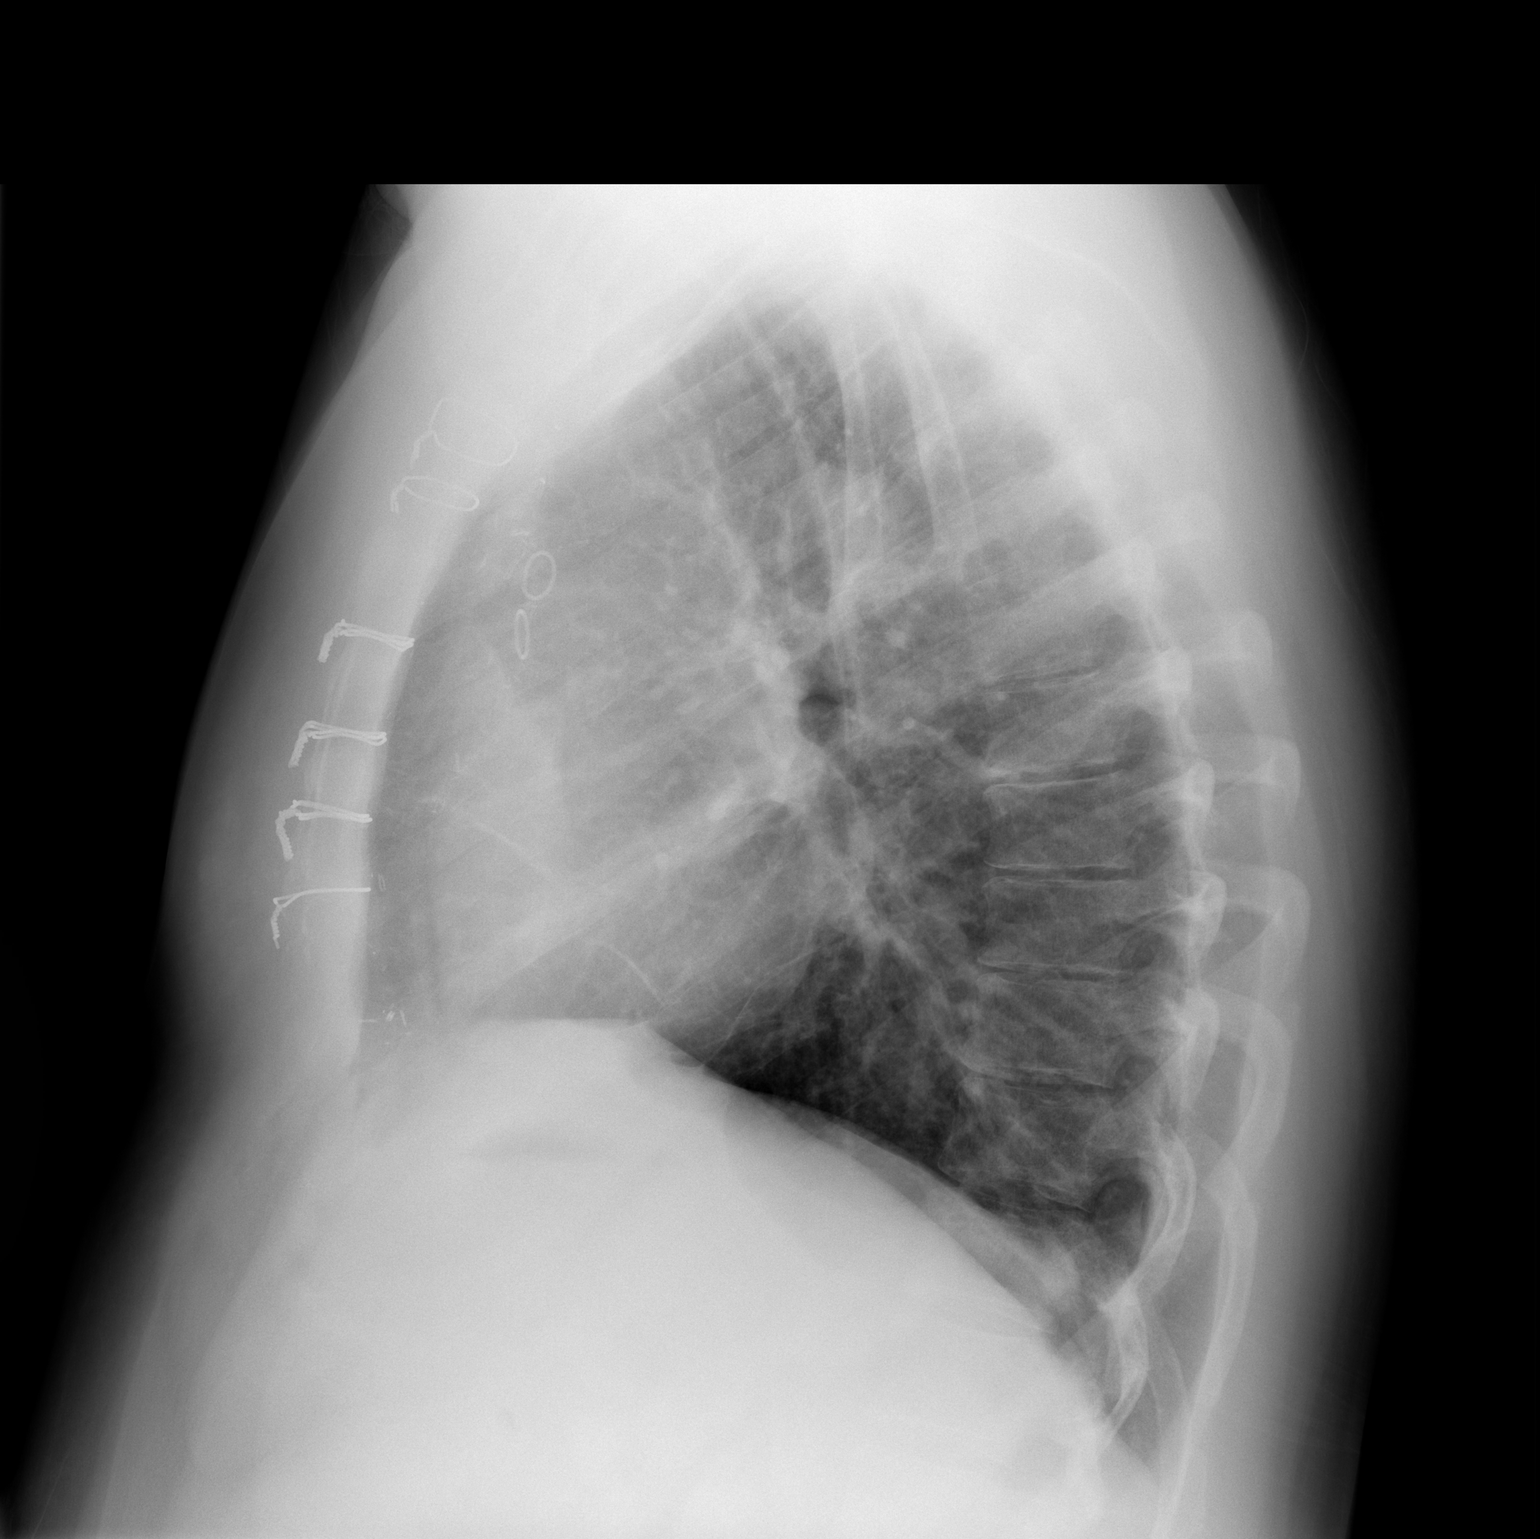

[2 of 2 positions shown; findings below may reference images not displayed]

FINDINGS: The heart size and mediastinal contours are within normal limits.
CABG with median sternotomy. No consolidation. No visible pleural
effusions or pneumothorax. No acute osseous abnormality. Prior
cervical spine fusion, partially imaged.
IMPRESSION: No active cardiopulmonary disease.

## 2021-08-11 ENCOUNTER — Other Ambulatory Visit: Payer: Self-pay | Admitting: Internal Medicine

## 2021-08-12 ENCOUNTER — Other Ambulatory Visit: Payer: Self-pay | Admitting: Internal Medicine
